# Patient Record
Sex: Male | Born: 1990 | Race: White | Hispanic: No | Marital: Single | Smoking: Current every day smoker
Health system: Southern US, Community
[De-identification: ages and names within clinical notes are randomized; demographics above are authoritative.]

---

## 2016-08-13 ENCOUNTER — Inpatient Hospital Stay (HOSPITAL_COMMUNITY)
Admission: EM | Admit: 2016-08-13 | Discharge: 2016-08-17 | DRG: 581 | Disposition: A | Discharge status: Home / Self Care | Payer: No Typology Code available for payment source | Attending: Internal Medicine | Admitting: Internal Medicine

## 2016-08-13 ENCOUNTER — Encounter (HOSPITAL_COMMUNITY): Payer: Self-pay

## 2016-08-13 DIAGNOSIS — F17210 Nicotine dependence, cigarettes, uncomplicated: Secondary | ICD-10-CM | POA: Diagnosis present

## 2016-08-13 DIAGNOSIS — B182 Chronic viral hepatitis C: Secondary | ICD-10-CM | POA: Diagnosis present

## 2016-08-13 DIAGNOSIS — L0291 Cutaneous abscess, unspecified: Secondary | ICD-10-CM

## 2016-08-13 DIAGNOSIS — B9561 Methicillin susceptible Staphylococcus aureus infection as the cause of diseases classified elsewhere: Secondary | ICD-10-CM | POA: Diagnosis present

## 2016-08-13 DIAGNOSIS — L02413 Cutaneous abscess of right upper limb: Secondary | ICD-10-CM | POA: Diagnosis present

## 2016-08-13 DIAGNOSIS — L03113 Cellulitis of right upper limb: Secondary | ICD-10-CM | POA: Diagnosis not present

## 2016-08-13 DIAGNOSIS — L0390 Cellulitis, unspecified: Secondary | ICD-10-CM | POA: Diagnosis present

## 2016-08-13 LAB — CBC WITH DIFFERENTIAL/PLATELET
BASOS ABS: 0 10*3/uL (ref 0.0–0.1)
Basophils Relative: 0 %
Eosinophils Absolute: 0.1 10*3/uL (ref 0.0–0.7)
Eosinophils Relative: 1 %
HEMATOCRIT: 41.1 % (ref 39.0–52.0)
Hemoglobin: 14.6 g/dL (ref 13.0–17.0)
LYMPHS PCT: 25 %
Lymphs Abs: 2.9 10*3/uL (ref 0.7–4.0)
MCH: 31.3 pg (ref 26.0–34.0)
MCHC: 35.5 g/dL (ref 30.0–36.0)
MCV: 88 fL (ref 78.0–100.0)
Monocytes Absolute: 1 10*3/uL (ref 0.1–1.0)
Monocytes Relative: 8 %
NEUTROS ABS: 7.7 10*3/uL (ref 1.7–7.7)
NEUTROS PCT: 66 %
Platelets: 188 10*3/uL (ref 150–400)
RBC: 4.67 MIL/uL (ref 4.22–5.81)
RDW: 12.4 % (ref 11.5–15.5)
WBC: 11.6 10*3/uL — AB (ref 4.0–10.5)

## 2016-08-13 MED ORDER — MORPHINE SULFATE (PF) 4 MG/ML IV SOLN
4.0000 mg | Freq: Once | INTRAVENOUS | Status: AC
  Administered 2016-08-13: 4 mg via INTRAVENOUS
  Filled 2016-08-13: qty 1

## 2016-08-13 MED ORDER — VANCOMYCIN HCL IN DEXTROSE 1-5 GM/200ML-% IV SOLN
1000.0000 mg | Freq: Once | INTRAVENOUS | Status: AC
  Administered 2016-08-13: 1000 mg via INTRAVENOUS
  Filled 2016-08-13: qty 200

## 2016-08-13 NOTE — ED Triage Notes (Signed)
Pt here for arm swelling of right arm, reports family hx of blood clots, denies trauma or iv drug use. Pulses present. Arm is swollen and warm to touch.

## 2016-08-13 NOTE — ED Provider Notes (Signed)
[facility]-EMERGENCY DEPT Provider Note   CSN: [id number] Arrival date & time: 08/13/16  0865   By signing my name below, I, Clovis Pu, attest that this documentation has been prepared under the direction and in the presence of Dione Booze, MD  Electronically Signed: Clovis Pu, ED Scribe. 08/13/16. 11:17 PM.   History   Chief Complaint Chief Complaint  Patient presents with  . Arm Swelling   The history is provided by the patient. No language interpreter was used.   HPI Comments:  Clarence Garrett is a 26 y.o. male, with a family hx of blood clots, who presents to the Emergency Department complaining of acute onset, worsening right forearm swelling x 3 days. Pt also reports associated "8/10" throbbing pain, skin redness, skin warmth and numbness. His pain is worse upon palpation. He has taken percocet with minimal relief. Pt denies fevers, chills, chest pain. No other associated symptoms noted. No PCP in [place], [place].  History reviewed. No pertinent past medical history.  There are no active problems to display for this patient.   History reviewed. No pertinent surgical history.     Home Medications    Prior to Admission medications   Not on File    Family History History reviewed. No pertinent family history.  Social History Social History  Substance Use Topics  . Smoking status: Not on file  . Smokeless tobacco: Not on file  . Alcohol use Not on file     Allergies   Patient has no known allergies.   Review of Systems Review of Systems  Constitutional: Negative for chills and fever.  Cardiovascular: Negative for chest pain.  Musculoskeletal: Positive for joint swelling and myalgias.  Skin: Positive for color change.  Neurological: Positive for numbness.  All other systems reviewed and are negative.  Physical Exam Updated Vital Signs BP 126/74   Pulse 79   Temp 98.6 F (37 C)   Resp 22   SpO2 95%   Physical Exam  Constitutional: He is oriented to  person, place, and time. He appears well-developed and well-nourished.  HENT:  Head: Normocephalic and atraumatic.  Eyes: EOM are normal. Pupils are equal, round, and reactive to light.  Neck: Normal range of motion. Neck supple. No JVD present.  Cardiovascular: Normal rate, regular rhythm and normal heart sounds.   No murmur heard. Pulmonary/Chest: Effort normal and breath sounds normal. He has no wheezes. He has no rales. He exhibits no tenderness.  Abdominal: Soft. Bowel sounds are normal. He exhibits no distension and no mass. There is no tenderness.  Musculoskeletal: Normal range of motion. He exhibits edema.  Swelling of the right forearm with circumferential  3 cm greater than left forearm. Induration in the right antecubital area. No fluctuance. Distal neurovascular intact  Lymphadenopathy:    He has no cervical adenopathy.  Neurological: He is alert and oriented to person, place, and time. No cranial nerve deficit. He exhibits normal muscle tone. Coordination normal.  Skin: Skin is warm and dry. No rash noted.  Psychiatric: He has a normal mood and affect. His behavior is normal. Judgment and thought content normal.  Nursing note and vitals reviewed.    ED Treatments / Results  DIAGNOSTIC STUDIES:  Oxygen Saturation is 95% on RA, normal by my interpretation.    COORDINATION OF CARE:  11:14 PM Discussed treatment plan with pt at bedside and pt agreed to plan.  Labs (all labs ordered are listed, but only abnormal results are displayed) Labs Reviewed  CBC WITH  DIFFERENTIAL/PLATELET - Abnormal; Notable for the following:       Result Value   WBC 11.6 (*)    All other components within normal limits  BASIC METABOLIC PANEL - Abnormal; Notable for the following:    Chloride 98 (*)    All other components within normal limits  SEDIMENTATION RATE - Abnormal; Notable for the following:    Sed Rate 30 (*)    All other components within normal limits    Procedures Procedures  (including critical care time)  Medications Ordered in ED Medications  vancomycin (VANCOCIN) IVPB 1000 mg/200 mL premix (not administered)  morphine 4 MG/ML injection 4 mg (not administered)     Initial Impression / Assessment and Plan / ED Course  I have reviewed the triage vital signs and the nursing notes.  Pertinent lab results that were available during my care of the patient were reviewed by me and considered in my medical decision making (see chart for details).  Patient with rather rapidly progressing cellulitis of the right forearm. There is some induration present and were evaluated with ultrasound with no obvious fluid collection that was drainable. Unfortunately, images were not able to be archived. He is started on IV antibiotics for cellulitis. He started on vancomycin. Screening labs are obtained and are unremarkable. Sedimentation rate is only mildly elevated. WBC is very mildly elevated. Case is discussed with Dr. Julian ReilGardner of [facility] who agrees to admit the patient.  Final Clinical Impressions(s) / ED Diagnoses   Final diagnoses:  Cellulitis of right forearm    New Prescriptions New Prescriptions   No medications on file   I personally performed the services described in this documentation, which was scribed in my presence. The recorded information has been reviewed and is accurate.       Dione Boozeavid Derriana Oser, MD 08/14/16 [phone]

## 2016-08-14 ENCOUNTER — Encounter (HOSPITAL_COMMUNITY)
Admission: EM | Disposition: A | Discharge status: Home / Self Care | Payer: Self-pay | Source: Home / Self Care | Attending: Internal Medicine

## 2016-08-14 ENCOUNTER — Inpatient Hospital Stay (HOSPITAL_COMMUNITY): Payer: No Typology Code available for payment source | Admitting: Certified Registered Nurse Anesthetist

## 2016-08-14 ENCOUNTER — Inpatient Hospital Stay (HOSPITAL_COMMUNITY): Payer: No Typology Code available for payment source

## 2016-08-14 ENCOUNTER — Encounter (HOSPITAL_COMMUNITY): Payer: Self-pay | Admitting: Radiology

## 2016-08-14 DIAGNOSIS — B9561 Methicillin susceptible Staphylococcus aureus infection as the cause of diseases classified elsewhere: Secondary | ICD-10-CM | POA: Diagnosis present

## 2016-08-14 DIAGNOSIS — L02413 Cutaneous abscess of right upper limb: Secondary | ICD-10-CM | POA: Diagnosis present

## 2016-08-14 DIAGNOSIS — L0390 Cellulitis, unspecified: Secondary | ICD-10-CM | POA: Diagnosis present

## 2016-08-14 DIAGNOSIS — L03113 Cellulitis of right upper limb: Secondary | ICD-10-CM | POA: Diagnosis present

## 2016-08-14 DIAGNOSIS — B182 Chronic viral hepatitis C: Secondary | ICD-10-CM | POA: Diagnosis present

## 2016-08-14 DIAGNOSIS — F17210 Nicotine dependence, cigarettes, uncomplicated: Secondary | ICD-10-CM | POA: Diagnosis present

## 2016-08-14 DIAGNOSIS — L0291 Cutaneous abscess, unspecified: Secondary | ICD-10-CM | POA: Insufficient documentation

## 2016-08-14 HISTORY — PX: I & D EXTREMITY: SHX5045

## 2016-08-14 LAB — C-REACTIVE PROTEIN: CRP: 7.8 mg/dL — AB (ref <1.0)

## 2016-08-14 LAB — BASIC METABOLIC PANEL
Anion gap: 10 (ref 5–15)
BUN: 9 mg/dL (ref 6–20)
CHLORIDE: 98 mmol/L — AB (ref 101–111)
CO2: 27 mmol/L (ref 22–32)
CREATININE: 1 mg/dL (ref 0.61–1.24)
Calcium: 8.9 mg/dL (ref 8.9–10.3)
GFR calc non Af Amer: >60 mL/min (ref >60)
GLUCOSE: 91 mg/dL (ref 65–99)
Potassium: 4.4 mmol/L (ref 3.5–5.1)
Sodium: 135 mmol/L (ref 135–145)

## 2016-08-14 LAB — PROTIME-INR
INR: 1.09
PROTHROMBIN TIME: 14.2 s (ref 11.4–15.2)

## 2016-08-14 LAB — SEDIMENTATION RATE: Sed Rate: 30 mm/hr — ABNORMAL HIGH (ref 0–16)

## 2016-08-14 LAB — APTT: aPTT: 34 seconds (ref 24–36)

## 2016-08-14 SURGERY — IRRIGATION AND DEBRIDEMENT EXTREMITY
Anesthesia: General | Site: Arm Lower | Laterality: Right

## 2016-08-14 MED ORDER — SUCCINYLCHOLINE CHLORIDE 20 MG/ML IJ SOLN
INTRAMUSCULAR | Status: DC | PRN
  Administered 2016-08-14: 140 mg via INTRAVENOUS

## 2016-08-14 MED ORDER — HEPARIN SODIUM (PORCINE) 5000 UNIT/ML IJ SOLN: 5000.0000 [IU] | Freq: Three times a day (TID) | INTRAMUSCULAR | Status: DC

## 2016-08-14 MED ORDER — ENOXAPARIN SODIUM 40 MG/0.4ML ~~LOC~~ SOLN
40.0000 mg | Freq: Every day | SUBCUTANEOUS | Status: DC
  Filled 2016-08-14: qty 0.4

## 2016-08-14 MED ORDER — OXYCODONE-ACETAMINOPHEN 5-325 MG PO TABS
1.0000 | ORAL_TABLET | Freq: Four times a day (QID) | ORAL | Status: DC | PRN
  Administered 2016-08-14 – 2016-08-15 (×4): 2 via ORAL
  Filled 2016-08-14 (×4): qty 2

## 2016-08-14 MED ORDER — 0.9 % SODIUM CHLORIDE (POUR BTL) OPTIME
TOPICAL | Status: DC | PRN
  Administered 2016-08-14: 1000 mL

## 2016-08-14 MED ORDER — LACTATED RINGERS IV SOLN
INTRAVENOUS | Status: DC | PRN
  Administered 2016-08-14 (×2): via INTRAVENOUS

## 2016-08-14 MED ORDER — ONDANSETRON HCL 4 MG/2ML IJ SOLN: 4.0000 mg | Freq: Once | INTRAMUSCULAR | Status: DC | PRN

## 2016-08-14 MED ORDER — HYDROMORPHONE HCL 1 MG/ML IJ SOLN
0.2500 mg | INTRAMUSCULAR | Status: DC | PRN
  Administered 2016-08-14: 0.5 mg via INTRAVENOUS

## 2016-08-14 MED ORDER — PROPOFOL 10 MG/ML IV BOLUS
INTRAVENOUS | Status: DC | PRN
  Administered 2016-08-14: 50 mg via INTRAVENOUS
  Administered 2016-08-14: 150 mg via INTRAVENOUS

## 2016-08-14 MED ORDER — PIPERACILLIN-TAZOBACTAM 3.375 G IVPB
3.3750 g | Freq: Three times a day (TID) | INTRAVENOUS | Status: DC
  Administered 2016-08-14 – 2016-08-15 (×4): 3.375 g via INTRAVENOUS
  Filled 2016-08-14 (×6): qty 50

## 2016-08-14 MED ORDER — IOPAMIDOL (ISOVUE-300) INJECTION 61%
INTRAVENOUS | Status: AC
  Administered 2016-08-14: 100 mL
  Filled 2016-08-14: qty 100

## 2016-08-14 MED ORDER — PHENYLEPHRINE HCL 10 MG/ML IJ SOLN
INTRAMUSCULAR | Status: DC | PRN
  Administered 2016-08-14 (×3): 80 ug via INTRAVENOUS

## 2016-08-14 MED ORDER — HYDROMORPHONE HCL 1 MG/ML IJ SOLN
INTRAMUSCULAR | Status: AC
  Filled 2016-08-14: qty 0.5

## 2016-08-14 MED ORDER — NICOTINE 21 MG/24HR TD PT24
21.0000 mg | MEDICATED_PATCH | Freq: Every day | TRANSDERMAL | Status: DC
  Administered 2016-08-14 – 2016-08-17 (×4): 21 mg via TRANSDERMAL
  Filled 2016-08-14 (×4): qty 1

## 2016-08-14 MED ORDER — SODIUM CHLORIDE 0.9 % IR SOLN
Status: DC | PRN
  Administered 2016-08-14 (×2): 3000 mL

## 2016-08-14 MED ORDER — VANCOMYCIN HCL IN DEXTROSE 1-5 GM/200ML-% IV SOLN: 1000.0000 mg | Freq: Once | INTRAVENOUS | Status: DC

## 2016-08-14 MED ORDER — MIDAZOLAM HCL 5 MG/5ML IJ SOLN
INTRAMUSCULAR | Status: DC | PRN
  Administered 2016-08-14: 2 mg via INTRAVENOUS

## 2016-08-14 MED ORDER — FENTANYL CITRATE (PF) 100 MCG/2ML IJ SOLN
INTRAMUSCULAR | Status: DC | PRN
  Administered 2016-08-14 (×2): 50 ug via INTRAVENOUS
  Administered 2016-08-14: 100 ug via INTRAVENOUS

## 2016-08-14 MED ORDER — CEFAZOLIN IN D5W 1 GM/50ML IV SOLN
1.0000 g | Freq: Three times a day (TID) | INTRAVENOUS | Status: DC
  Administered 2016-08-14: 1 g via INTRAVENOUS
  Filled 2016-08-14: qty 50

## 2016-08-14 MED ORDER — VANCOMYCIN HCL IN DEXTROSE 1-5 GM/200ML-% IV SOLN
1000.0000 mg | Freq: Three times a day (TID) | INTRAVENOUS | Status: DC
  Administered 2016-08-14 – 2016-08-16 (×6): 1000 mg via INTRAVENOUS
  Filled 2016-08-14 (×9): qty 200

## 2016-08-14 MED ORDER — PIPERACILLIN-TAZOBACTAM 3.375 G IVPB 30 MIN: 3.3750 g | Freq: Once | INTRAVENOUS | Status: DC

## 2016-08-14 MED ORDER — SODIUM CHLORIDE 0.9 % IV SOLN
INTRAVENOUS | Status: DC
  Administered 2016-08-14: 18:00:00 via INTRAVENOUS
  Administered 2016-08-14: 1000 mL via INTRAVENOUS

## 2016-08-14 MED ORDER — HYDROMORPHONE HCL 2 MG/ML IJ SOLN
1.0000 mg | INTRAMUSCULAR | Status: DC | PRN
  Administered 2016-08-14 – 2016-08-17 (×11): 1 mg via INTRAVENOUS
  Filled 2016-08-14 (×13): qty 1

## 2016-08-14 MED ORDER — LIDOCAINE HCL (CARDIAC) 20 MG/ML IV SOLN
INTRAVENOUS | Status: DC | PRN
  Administered 2016-08-14: 100 mg via INTRAVENOUS

## 2016-08-14 MED ORDER — MEPERIDINE HCL 25 MG/ML IJ SOLN: 6.2500 mg | INTRAMUSCULAR | Status: DC | PRN

## 2016-08-14 MED ORDER — ONDANSETRON HCL 4 MG/2ML IJ SOLN
INTRAMUSCULAR | Status: DC | PRN
  Administered 2016-08-14: 4 mg via INTRAVENOUS

## 2016-08-14 SURGICAL SUPPLY — 51 items
BANDAGE ELASTIC 3 VELCRO ST LF (GAUZE/BANDAGES/DRESSINGS) ×3 IMPLANT
BANDAGE ELASTIC 4 VELCRO ST LF (GAUZE/BANDAGES/DRESSINGS) ×3 IMPLANT
BNDG COHESIVE 4X5 TAN STRL (GAUZE/BANDAGES/DRESSINGS) IMPLANT
BNDG GAUZE ELAST 4 BULKY (GAUZE/BANDAGES/DRESSINGS) ×6 IMPLANT
BNDG GAUZE STRTCH 6 (GAUZE/BANDAGES/DRESSINGS) IMPLANT
BRUSH SCRUB DISP (MISCELLANEOUS) ×3 IMPLANT
COVER SURGICAL LIGHT HANDLE (MISCELLANEOUS) ×3 IMPLANT
CUFF TOURNIQUET SINGLE 18IN (TOURNIQUET CUFF) ×3 IMPLANT
DRAIN PENROSE 1/4X12 LTX STRL (WOUND CARE) ×3 IMPLANT
DRAPE U-SHAPE 47X51 STRL (DRAPES) ×3 IMPLANT
DRSG ADAPTIC 3X8 NADH LF (GAUZE/BANDAGES/DRESSINGS) ×3 IMPLANT
DRSG PAD ABDOMINAL 8X10 ST (GAUZE/BANDAGES/DRESSINGS) ×6 IMPLANT
ELECT REM PT RETURN 9FT ADLT (ELECTROSURGICAL) ×3
ELECTRODE REM PT RTRN 9FT ADLT (ELECTROSURGICAL) ×1 IMPLANT
GAUZE SPONGE 4X4 12PLY STRL (GAUZE/BANDAGES/DRESSINGS) ×3 IMPLANT
GLOVE BIO SURGEON STRL SZ7.5 (GLOVE) ×3 IMPLANT
GLOVE BIO SURGEON STRL SZ8 (GLOVE) ×3 IMPLANT
GLOVE BIOGEL PI IND STRL 7.5 (GLOVE) ×1 IMPLANT
GLOVE BIOGEL PI IND STRL 8 (GLOVE) ×1 IMPLANT
GLOVE BIOGEL PI INDICATOR 7.5 (GLOVE) ×2
GLOVE BIOGEL PI INDICATOR 8 (GLOVE) ×2
GOWN STRL REUS W/ TWL LRG LVL3 (GOWN DISPOSABLE) ×2 IMPLANT
GOWN STRL REUS W/ TWL XL LVL3 (GOWN DISPOSABLE) ×1 IMPLANT
GOWN STRL REUS W/TWL LRG LVL3 (GOWN DISPOSABLE) ×4
GOWN STRL REUS W/TWL XL LVL3 (GOWN DISPOSABLE) ×2
HANDPIECE INTERPULSE COAX TIP (DISPOSABLE)
KIT BASIN OR (CUSTOM PROCEDURE TRAY) ×3 IMPLANT
KIT ROOM TURNOVER OR (KITS) ×3 IMPLANT
MANIFOLD NEPTUNE II (INSTRUMENTS) ×3 IMPLANT
NS IRRIG 1000ML POUR BTL (IV SOLUTION) ×3 IMPLANT
PACK ORTHO EXTREMITY (CUSTOM PROCEDURE TRAY) ×3 IMPLANT
PAD ARMBOARD 7.5X6 YLW CONV (MISCELLANEOUS) ×6 IMPLANT
PAD CAST 3X4 CTTN HI CHSV (CAST SUPPLIES) ×1 IMPLANT
PAD CAST 4YDX4 CTTN HI CHSV (CAST SUPPLIES) ×1 IMPLANT
PADDING CAST COTTON 3X4 STRL (CAST SUPPLIES) ×2
PADDING CAST COTTON 4X4 STRL (CAST SUPPLIES) ×2
PADDING CAST COTTON 6X4 STRL (CAST SUPPLIES) IMPLANT
SET CYSTO W/LG BORE CLAMP LF (SET/KITS/TRAYS/PACK) ×3 IMPLANT
SET HNDPC FAN SPRY TIP SCT (DISPOSABLE) IMPLANT
SPONGE LAP 18X18 X RAY DECT (DISPOSABLE) ×3 IMPLANT
STOCKINETTE IMPERVIOUS 9X36 MD (GAUZE/BANDAGES/DRESSINGS) IMPLANT
SUT ETHILON 2 0 FS 18 (SUTURE) ×3 IMPLANT
SUT PDS AB 2-0 CT1 27 (SUTURE) IMPLANT
TOWEL OR 17X24 6PK STRL BLUE (TOWEL DISPOSABLE) ×3 IMPLANT
TOWEL OR 17X26 10 PK STRL BLUE (TOWEL DISPOSABLE) ×3 IMPLANT
TUBE ANAEROBIC SPECIMEN COL (MISCELLANEOUS) IMPLANT
TUBE CONNECTING 12'X1/4 (SUCTIONS) ×1
TUBE CONNECTING 12X1/4 (SUCTIONS) ×2 IMPLANT
UNDERPAD 30X30 (UNDERPADS AND DIAPERS) ×3 IMPLANT
WATER STERILE IRR 1000ML POUR (IV SOLUTION) IMPLANT
YANKAUER SUCT BULB TIP NO VENT (SUCTIONS) ×3 IMPLANT

## 2016-08-14 NOTE — Progress Notes (Signed)
Patient admitted from ED around 1400H and went to the OR for surgery. Patient is AAO x4, OOB independently.

## 2016-08-14 NOTE — Anesthesia Procedure Notes (Signed)
Procedure Name: Intubation Date/Time: 08/14/2016 3:45 PM Performed by: Reine JustFLOWERS, Owenn Rothermel T Pre-anesthesia Checklist: Patient identified, Emergency Drugs available, Suction available, Patient being monitored and Timeout performed Patient Re-evaluated:Patient Re-evaluated prior to inductionOxygen Delivery Method: Circle system utilized and Simple face mask Preoxygenation: Pre-oxygenation with 100% oxygen Intubation Type: IV induction Ventilation: Mask ventilation without difficulty Laryngoscope Size: Miller and 3 Grade View: Grade I Tube type: Oral Tube size: 7.5 mm Number of attempts: 1 Airway Equipment and Method: Patient positioned with wedge pillow and Stylet Placement Confirmation: ETT inserted through vocal cords under direct vision,  positive ETCO2 and breath sounds checked- equal and bilateral Secured at: 22 cm Tube secured with: Tape Dental Injury: Teeth and Oropharynx as per pre-operative assessment

## 2016-08-14 NOTE — Progress Notes (Signed)
Admission nurse notified of new patient.  

## 2016-08-14 NOTE — Progress Notes (Signed)
Patient arrived from PACU to 6n19, new right arm ace wrap post I&D. Patient in moderate pain just received pain medicine at 1712. Iv intact, on 2L o2,VSS, alert and oriented, will continue to monitor.

## 2016-08-14 NOTE — Anesthesia Postprocedure Evaluation (Signed)
Anesthesia Post Note  Patient: Clarence Garrett  Procedure(s) Performed: Procedure(s) (LRB): IRRIGATION AND DEBRIDEMENT EXTREMITY FOREARM (Right)  Patient location during evaluation: PACU Anesthesia Type: General Level of consciousness: awake and alert Pain management: pain level controlled Vital Signs Assessment: post-procedure vital signs reviewed and stable Respiratory status: spontaneous breathing, nonlabored ventilation, respiratory function stable and patient connected to nasal cannula oxygen Cardiovascular status: blood pressure returned to baseline and stable Postop Assessment: no signs of nausea or vomiting Anesthetic complications: no       Last Vitals:  Vitals:   08/14/16 1715 08/14/16 1724  BP: (!) 147/91 (!) 146/81  Pulse:  76  Resp:  18  Temp:      Last Pain:  Vitals:   08/14/16 1715  TempSrc:   PainSc: Asleep                 [name]

## 2016-08-14 NOTE — Progress Notes (Signed)
Pharmacy Antibiotic Note  Clarence HartshornDavid Garrett is a 26 y.o. male admitted on 08/13/2016 with worsening cellulitis after suffering a scrape on his arm, from scraping his arm against metal while working. WBC 11.6, SCr wnl, afebrile, BP wnl.   Plan: -Vancomycin 1g/8h -Zosyn 3.375 g IV q12h -Monitor renal fx, cultures, VT as indicated -Deescalate as appropriate   Recent Labs Lab 08/13/16 2327  WBC 11.6*  CREATININE 1.00    CrCl cannot be calculated (Unknown ideal weight.).    No Known Allergies   Antimicrobials this admission: 1/24 Ancef x1 1/24 vancomycin > 1/25 zosyn >  Dose adjustments this admission: N/A  Microbiology results: N/A  Thank you for allowing pharmacy to be a part of this patient's care.  Clarence FridayMasters, Clarence Garrett M 08/14/2016 8:45 AM

## 2016-08-14 NOTE — Consult Note (Signed)
[facility] H&P/Consult     Chief Complaint:  R arm abscess Requesting: Dr. Tana Coast HPI:   Clarence Garrett is an 26 y.o. RHD white male presented to the emergency department yesterday evening around 2000. He presented with increasing right forearm pain, swelling and redness since Sunday. Patient does not recall any specific event other than hitting his right arm on a solar panel last week with small superficial laceration. Thinks it may have been a spider bite. His pain has been worsened over the last several days necessitating evaluation in the emergency department. He is also noted expanding erythema to the ventral aspect of his right forearm proximally. Patient denies any fevers or chills. No recent illnesses no body aches. No headaches, no neck pain. No abdominal pain. No shortness of breath. No palpitations.   Initially patient was started on Ancef and given 1 dose of vancomycin in the ED. His pain was worse this morning. CT was obtained which noted abscess in the superficial tissues over the antecubital fossa. Antibiotics were switched to Zosyn and vancomycin. Sedimentation rate and CRP are elevated   Patient denies use of IV drugs or other drugs. Denies alcohol use. States that he does smoke 1 pack a day   Patient works for a Education [employer], environmental. 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 He's been here about 2 weeks.       Pt last ate 0830-0900  History reviewed. No pertinent past medical history.  History reviewed. No pertinent surgical history.  Family History  Problem Relation Age of Onset  . Deep vein thrombosis Father     spontaneous   . Deep vein thrombosis Paternal Grandmother     spontaneous    Social History:  reports that he has been smoking Cigarettes.  He has been smoking about 1.00 pack per day. He does not have any smokeless tobacco history on file. He reports that he does not drink alcohol or use  drugs.  Allergies: No Known Allergies  No current facility-administered medications on file prior to encounter.    No current outpatient prescriptions on file prior to encounter.    Results for orders placed or performed during the hospital encounter of 08/13/16 (from the past 48 hour(s))  CBC with Differential     Status: Abnormal   Collection Time: 08/13/16 11:27 PM  Result Value Ref Range   WBC 11.6 (H) 4.0 - 10.5 K/uL   RBC 4.67 4.22 - 5.81 MIL/uL   Hemoglobin 14.6 13.0 - 17.0 g/dL   HCT 41.1 39.0 - 52.0 %   MCV 88.0 78.0 - 100.0 fL   MCH 31.3 26.0 - 34.0 pg   MCHC 35.5 30.0 - 36.0 g/dL   RDW 12.4 11.5 - 15.5 %   Platelets 188 150 - 400 K/uL   Neutrophils Relative % 66 %   Neutro Abs 7.7 1.7 - 7.7 K/uL   Lymphocytes Relative 25 %   Lymphs Abs 2.9 0.7 - 4.0 K/uL   Monocytes Relative 8 %   Monocytes Absolute 1.0 0.1 - 1.0 K/uL   Eosinophils Relative 1 %   Eosinophils Absolute 0.1 0.0 - 0.7 K/uL   Basophils Relative 0 %   Basophils Absolute 0.0 0.0 - 0.1 K/uL  Basic metabolic panel     Status: Abnormal   Collection Time: 08/13/16 11:27 PM  Result Value Ref Range   Sodium 135 135 - 145 mmol/L   Potassium 4.4 3.5 - 5.1 mmol/L  Chloride 98 (L) 101 - 111 mmol/L   CO2 27 22 - 32 mmol/L   Glucose, Bld 91 65 - 99 mg/dL   BUN 9 6 - 20 mg/dL   Creatinine, Ser 1.00 0.61 - 1.24 mg/dL   Calcium 8.9 8.9 - 10.3 mg/dL   GFR calc non Af Amer >60 >60 mL/min   GFR calc Af Amer >60 >60 mL/min    Comment: (NOTE) The eGFR has been calculated using the CKD EPI equation. This calculation has not been validated in all clinical situations. eGFR's persistently <60 mL/min signify possible Chronic Kidney Disease.    Anion gap 10 5 - 15  Sedimentation rate     Status: Abnormal   Collection Time: 08/13/16 11:27 PM  Result Value Ref Range   Sed Rate 30 (H) 0 - 16 mm/hr  C-reactive protein     Status: Abnormal   Collection Time: 08/14/16  9:08 AM  Result Value Ref Range   CRP 7.8 (H)  <1.0 mg/dL   Ct Forearm Right W Contrast  Result Date: 08/14/2016 CLINICAL DATA:  Erythema of the forearm.  Possible abscess. EXAM: CT OF THE UPPER RIGHT EXTREMITY WITH CONTRAST TECHNIQUE: Multidetector CT imaging of the upper right extremity was performed according to the standard protocol following intravenous contrast administration. COMPARISON:  None. CONTRAST:  14m ISOVUE-300 IOPAMIDOL (ISOVUE-300) INJECTION 61%<Contrast>1012mISOVUE-300 IOPAMIDOL (ISOVUE-300) INJECTION 61% FINDINGS: Bones/Joint/Cartilage No bony destructive findings characteristic of osteomyelitis. No elbow joint effusion. Ligaments Suboptimally assessed by CT. Muscles and Tendons Edema within along the margins of the distal biceps muscle due to the abscess. There is also fascia all edema tracking adjacent to the pronator teres and brachialis muscles in the vicinity abscess. Soft tissues 4.3 by 2.3 by 2.2 cm (volume = 11 cm^3) rim enhancing abscess along the distal medial margin of the biceps and interposed between the biceps, pronator teres, and brachialis muscles. This abscess is directly in the vicinity of the median cubital vein which is presumably thrombosed. There is some stranding in the vicinity of the adjacent brachial artery and median nerve, as well as inflammatory stranding along the distal upper arm. Circumferential subcutaneous edema is present in the forearm and tracks all the way down to the level of the radiocarpal joint. Aside from the deeper fasciitis in the vicinity of the abscess itself, the edema appears to be predominantly subcutaneous and no other abscesses are identified. IMPRESSION: 1. Abscess in the vicinity of the median cubital vein just proximal to the elbow, which is thrombosed or occluded. The 11 cc abscess tracks along the distal medial margin of the biceps muscle and interposed between the biceps, pronator teres, and brachialis muscles. There is local fascia all edema around the abscess along with  subcutaneous edema favoring cellulitis tracks in length distally in the forearm to the level of the wrist. No elbow joint effusion or CT findings of osteomyelitis. Electronically Signed   By: WaVan Clines.D.   On: 08/14/2016 10:39    Review of Systems  Constitutional: Negative for chills, fever and malaise/fatigue.  HENT: Negative for ear pain and sinus pain.   Respiratory: Negative for shortness of breath.   Cardiovascular: Negative for chest pain and palpitations.  Gastrointestinal: Negative for abdominal pain, nausea and vomiting.  Musculoskeletal: Negative for neck pain.  Neurological: Negative for tingling and sensory change.    Blood pressure 109/66, pulse 80, temperature 98.6 F (37 C), resp. rate 20, height 6' 1"  (1.854 m), weight 98 kg (216 lb), SpO2 99 %.  Physical Exam  Constitutional: He is oriented to person, place, and time. He is cooperative.  Non-toxic appearance. No distress.  HENT:  Head: Normocephalic.  Mouth/Throat: Normal dentition. No dental caries.  Denies any dental pain   Neck: Normal range of motion and full passive range of motion without pain. No spinous process tenderness and no muscular tenderness present. No neck rigidity. Normal range of motion present.  Cardiovascular: Regular rhythm.   Pulmonary/Chest: No respiratory distress.  Musculoskeletal:  Right upper Extremity  Inspection:   Extensive tattoos to all extremities and trunk     Erythema noted to medial elbow with some expansion proximally to distal upper arm and distally to proximal ventral forearm    No actively draining wounds     Very small healing laceration to ventral aspect of lateral proximal foreram. There is no erythema around this healing lac     No acute findings noted to hand, wrist, shoulder     Bony eval:    Shoulder, elbow, forearm, wrist and hand are nontender   Soft tissue:    Significant swelling to proximal ventral forearm and distal upper arm     Soft tissue is  indurated and tender to the antecubital space     Erythema medial aspect R elbow, extending over to forearm ventrally and medial distal aspect of anterior upper arm     No streaking erythema noted     Swelling controlled distally     Posterior elbow is w/o swelling     No erythema over olecranon   ROM:    No pain with passive ROM of elbow    ROM of elbow limited by swelling    Pt unable to actively flex elbow     Pain with forearm motion but not too severe    Wrist ROM w/o pain   Sensation:   Radial, ulnar, medial nv sensation intact    Axillary nerve sensation intact   Motor:   Radial, ulnar, median, AIN, PIN motor intact    Difficulty with elbow flexion  Vascular:    + radial pulse    Extremity warm     Compartments are soft, no pain with passive stretching    Left upper extremity and B LEx    No additional wounds appreciated   Small abrasion to anterior R lower leg but this is healing well    No additional acute findings noted   Neurological: He is alert and oriented to person, place, and time.  Psychiatric: He has a normal mood and affect. Cognition and memory are normal.      Assessment/Plan  26 y/o white male w/o PMHx with acute abscess R antecubital soft tissue   -abscess R antecubital space   OR for I&D this afternoon (last ate around 0830-0900)  Will likely leave wound open and put vac in place with return to OR in 2-3 days or may close loosely with penrose drain in place  May place in splint as well to limit motion at surgical site vs bulky soft dressing    Pt has been on abx since admission but will send intra-op cultures   Atypical to have a "normal" host develop Abscess even in setting of soft tissue laceration. Laceration not really in the same location as pts erythema or abscess, laceration does not have any surrounding erythema as well.    Additional preop labs have been ordered   tox screens ordered. Pt did receive morphine yesterday at 2300    Have  also ordered hepatitis panel and HIV screen   - ID:   On vanc and zosyn    - FEN/GI prophylaxis/Foley/Lines:  NPO    - Dispo:  OR this afternoon for I&D R arm    Jari Pigg, PA-C [facility] [phone] (P) 08/14/2016, 1:19 PM

## 2016-08-14 NOTE — H&P (Addendum)
History and Physical    Clarence Garrett ZOX:[id number] DOB: 04/20/1991 DOA: 08/13/2016   PCP: No primary care provider on file. Chief Complaint:  Chief Complaint  Patient presents with  . Arm Swelling    HPI: Clarence Garrett is a 26 y.o. male with medical history significant of previously healthy.  Patient presents to the ED with c/o R forearm swelling for the past 3 days.  Symptoms are worsening since onset.  8/10 throbbing pain.  Erythema.  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  He states his tetanus is up to date.  Tattoos on arms are not recent or new he states.  ED Course: Patient given 1 dose of vanc.  WBC 11k.  No other SIRS.  Review of Systems: As per HPI otherwise 10 point review of systems negative.    History reviewed. No pertinent past medical history.  History reviewed. No pertinent surgical history.   has no tobacco, alcohol, and drug history on file.  No Known Allergies  History reviewed. No pertinent family history. No family h/o MRSA, does have family history of clotting disorder.   Prior to Admission medications   Not on File    Physical Exam: Vitals:   08/13/16 1947 08/13/16 2229 08/13/16 2300 08/13/16 2315  BP: 139/78 115/55 126/74 132/71  Pulse: 80 95 79 88  Resp: 23 22    Temp: 98.6 F (37 C)     SpO2: 100% 95% 95% 97%      Constitutional: NAD, calm, comfortable Eyes: PERRL, lids and conjunctivae normal ENMT: Mucous membranes are moist. Posterior pharynx clear of any exudate or lesions.Normal dentition.  Neck: normal, supple, no masses, no thyromegaly Respiratory: clear to auscultation bilaterally, no wheezing, no crackles. Normal respiratory effort. No accessory muscle use.  Cardiovascular: Regular rate and rhythm, no murmurs / rubs / gallops. No extremity edema. 2+ pedal pulses. No carotid bruits.  Abdomen: no tenderness, no masses palpated.  No hepatosplenomegaly. Bowel sounds positive.  Musculoskeletal: no clubbing / cyanosis. No joint deformity upper and lower extremities. Good ROM, no contractures. Normal muscle tone.  Skin: R arm erythema, edema, tenderness, no obvious fluctuance or drainage.  Small scrape near R elbow. Neurologic: CN 2-12 grossly intact. Sensation intact, DTR normal. Strength 5/5 in all 4.  Psychiatric: Normal judgment and insight. Alert and oriented x 3. Normal mood.    Labs on Admission: I have personally reviewed following labs and imaging studies  CBC:  Recent Labs Lab 08/13/16 2327  WBC 11.6*  NEUTROABS 7.7  HGB 14.6  HCT 41.1  MCV 88.0  PLT 188   Basic Metabolic Panel:  Recent Labs Lab 08/13/16 2327  NA 135  K 4.4  CL 98*  CO2 27  GLUCOSE 91  BUN 9  CREATININE 1.00  CALCIUM 8.9   GFR: CrCl cannot be calculated (Unknown ideal weight.). Liver Function Tests: No results for input(s): AST, ALT, ALKPHOS, BILITOT, PROT, ALBUMIN in the last 168 hours. No results for input(s): LIPASE, AMYLASE in the last 168 hours. No results for input(s): AMMONIA in the last 168 hours. Coagulation Profile: No results for input(s): INR, PROTIME in the last 168 hours. Cardiac Enzymes: No results for input(s): CKTOTAL, CKMB, CKMBINDEX, TROPONINI in the last 168 hours. BNP (last 3 results) No results for input(s): PROBNP in the last 8760 hours. HbA1C: No results for input(s): HGBA1C in the last 72 hours. CBG:  No results for input(s): GLUCAP in the last 168 hours. Lipid Profile: No results for input(s): CHOL, HDL, LDLCALC, TRIG, CHOLHDL, LDLDIRECT in the last 72 hours. Thyroid Function Tests: No results for input(s): TSH, T4TOTAL, FREET4, T3FREE, THYROIDAB in the last 72 hours. Anemia Panel: No results for input(s): VITAMINB12, FOLATE, FERRITIN, TIBC, IRON, RETICCTPCT in the last 72 hours. Urine analysis: No results found for: COLORURINE, APPEARANCEUR, LABSPEC, PHURINE, GLUCOSEU, HGBUR,  BILIRUBINUR, KETONESUR, PROTEINUR, UROBILINOGEN, NITRITE, LEUKOCYTESUR Sepsis Labs: @LABRCNTIP (procalcitonin:4,lacticidven:4) )No results found for this or any previous visit (from the past 240 hour(s)).   Radiological Exams on Admission: No results found.  EKG: Independently reviewed.  Assessment/Plan Principal Problem:   Cellulitis of right forearm    1. Cellulitis of R forearm - 1. Tetanus up to date per patient 2. Got 1 dose of vanc in ED 3. Will put patient on Ancef 1gm Q8H per cellulitis pathway. 4. Patient is not septic, cellulitis is non-purulent at this point, and I dont really have any MRSA high risk factors to justify leaving him on vancomycin for the moment.  If he fails to improve or worsens then plan to resume vanc for MRSA coverage.   DVT prophylaxis: Lovenox Code Status: Full Family Communication: No family in room Consults called: None Admission status: Admit to obs   GARDNER, Heywood IlesJARED M. DO [facility] Pager [phone] from 7PM-7AM  If 7AM-7PM, please contact the day physician for the patient [email or web address] Password [email or web address]  08/14/2016, 1:20 AM

## 2016-08-14 NOTE — Brief Op Note (Signed)
08/13/2016 - 08/14/2016  4:38 PM  PATIENT:  Clarence Garrett  26 y.o. male  PRE-OPERATIVE DIAGNOSIS:  RIGHT ELBOW ABSCESS ANTECUBITAL FOSSA  POST-OPERATIVE DIAGNOSIS:  RIGHT ELBOW ABSCESS ANTECUBITAL FOSSA  PROCEDURE:  Procedure(s): INCISION AND DRAINAGE RIGHT ELBOW ANTECUBITAL ABSCESS (Right)  SURGEON:  Surgeon(s) and Role:    * Myrene GalasMichael Sarika Baldini, MD - Primary  PHYSICIAN ASSISTANT: Montez MoritaKEITH PAUL, PAC  ANESTHESIA:   general  EBL:  Total I/O In: 1000 [I.V.:700; IV Piggyback:300] Out: 0   BLOOD ADMINISTERED:none  DRAINS: Penrose drain in the antecubital space proximally and distally.   LOCAL MEDICATIONS USED:  NONE  SPECIMEN:  Source of Specimen:  abscess  DISPOSITION OF SPECIMEN:  micro  COUNTS:  YES  TOURNIQUET:  Yes  DICTATION: .Other Dictation: Dictation Number [id number]  PLAN OF CARE: Admit to inpatient   PATIENT DISPOSITION:  PACU - hemodynamically stable.   Delay start of Pharmacological VTE agent (>24hrs) due to surgical blood loss or risk of bleeding: yes

## 2016-08-14 NOTE — Anesthesia Preprocedure Evaluation (Signed)
Anesthesia Evaluation  Patient identified by MRN, date of birth, ID band Patient awake    Reviewed: Allergy & Precautions, NPO status , Patient's Chart, lab work & pertinent test results  Airway Mallampati: I  TM Distance: >3 FB Neck ROM: Full    Dental   Pulmonary Current Smoker,    Pulmonary exam normal        Cardiovascular Normal cardiovascular exam     Neuro/Psych    GI/Hepatic   Endo/Other    Renal/GU      Musculoskeletal   Abdominal   Peds  Hematology   Anesthesia Other Findings   Reproductive/Obstetrics                             Anesthesia Physical Anesthesia Plan  ASA: II  Anesthesia Plan: General   Post-op Pain Management:    Induction: Intravenous  Airway Management Planned: LMA  Additional Equipment:   Intra-op Plan:   Post-operative Plan: Extubation in OR  Informed Consent: I have reviewed the patients History and Physical, chart, labs and discussed the procedure including the risks, benefits and alternatives for the proposed anesthesia with the patient or authorized representative who has indicated his/her understanding and acceptance.     Plan Discussed with: CRNA and Surgeon  Anesthesia Plan Comments:         Anesthesia Quick Evaluation  

## 2016-08-14 NOTE — OR Nursing (Signed)
1/4 inch penrose placed in right forearm by surgeon.

## 2016-08-14 NOTE — Progress Notes (Signed)
[facility]                                                                              Patient Demographics  Clarence Garrett, is a 26 y.o. male, DOB - 08-29-1990, ZOX:[id number]  Admit date - 08/13/2016   Admitting Physician No admitting provider for patient encounter.  Outpatient Primary MD for the patient is No primary care provider on file.  Outpatient specialists:   LOS - 0  days    Chief Complaint  Patient presents with  . Arm Swelling       Brief summary  Patient is a 26 year old male with no significant past medical history presented with right forearm swelling for the past 3  days, with 8/10 throbbing pain, erythema. 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 His tetanus shot is up-to-date.    Assessment & Plan    Principal Problem:   Cellulitis And abscess of right forearm - Patient seen and examined, feels worsening since admission. Area examined with erythema and 3 cm tender induration in the vicinity of the elbow joint - Stat CT forearm ordered which showed abscess in the vicinity of the median cubital vein, 4.3 x 2.3 x 2.2 cm  rim enhancing abscess along the distal margin of the biceps and interposed between the biceps, pronator teres and brachialis muscle - Discontinued Ancef, changed antibiotics to IV vancomycin and Zosyn - Discussed with orthopedics, Dr. Carola Frost, recommended NPO, possible OR today - Continue pain control, IV fluids  Code Status:  Full  DVT Prophylaxis:   SCD's Family Communication: Discussed in detail with the patient, all imaging results, lab results explained to the patient   Disposition Plan:   Time Spent in minutes  25  minutes  Procedures:  CT right forearm  Consultants:   Orthopedics, Dr. Carola Frost  Antimicrobials:   IV vancomycin 1/25  IV Zosyn 1/25   Medications  Scheduled Meds: Continuous Infusions: . sodium chloride    . piperacillin-tazobactam (ZOSYN)   IV Stopped (08/14/16 1155)  . vancomycin Stopped (08/14/16 1126)   PRN Meds:.HYDROmorphone (DILAUDID) injection   Antibiotics   Anti-infectives    Start     Dose/Rate Route Frequency Ordered Stop   08/14/16 0930  vancomycin (VANCOCIN) IVPB 1000 mg/200 mL premix     1,000 mg 200 mL/hr over 60 Minutes Intravenous Every 8 hours 08/14/16 0903     08/14/16 0930  piperacillin-tazobactam (ZOSYN) IVPB 3.375 g     3.375 g 12.5 mL/hr over 240 Minutes Intravenous Every 8 hours 08/14/16 0903     08/14/16 0845  piperacillin-tazobactam (ZOSYN) IVPB 3.375 g  Status:  Discontinued     3.375 g 100 mL/hr over 30 Minutes Intravenous  Once 08/14/16 0834 08/14/16 0904   08/14/16 0845  vancomycin (VANCOCIN) IVPB 1000 mg/200 mL premix  Status:  Discontinued     1,000 mg 200 mL/hr over 60 Minutes Intravenous  Once 08/14/16 0834 08/14/16 0904   08/14/16 0200  ceFAZolin (ANCEF) IVPB 1 g/50 mL premix  Status:  Discontinued     1 g 100 mL/hr over 30 Minutes Intravenous  Every 8 hours 08/14/16 0117 08/14/16 0834   08/13/16 2315  vancomycin (VANCOCIN) IVPB 1000 mg/200 mL premix     1,000 mg 200 mL/hr over 60 Minutes Intravenous  Once 08/13/16 2313 08/14/16 [id number]        Subjective:   Clarence Garrett was seen and examined today. Complaining of pain in the right forearm 8/10, throbbing, no nausea or vomiting. Patient denies dizziness, chest pain, shortness of breath, abdominal pain, N/V/D/C, new weakness, numbess, tingling. No acute events overnight.    Objective:   Vitals:   08/14/16 0200 08/14/16 0300 08/14/16 0345 08/14/16 0900  BP: 125/69 118/69 109/66   Pulse: 78 67 80   Resp:  20    Temp:      SpO2: 96% 93% 99%   Weight:    98 kg (216 lb)  Height:    6\' 1"  (1.854 m)    Intake/Output Summary (Last 24 hours) at 08/14/16 1202 Last data filed at 08/14/16 1155  Gross per 24 hour  Intake              350 ml  Output                0 ml  Net              350 ml     Wt Readings from Last 3  Encounters:  08/14/16 98 kg (216 lb)     Exam  General: Alert and oriented x 3, NAD  HEENT:   Neck: Supple, no JVD, no masses  Cardiovascular: S1 S2 auscultated, no rubs, murmurs or gallops. Regular rate and rhythm.  Respiratory: Clear to auscultation bilaterally, no wheezing, rales or rhonchi  Gastrointestinal: Soft, nontender, nondistended, + bowel sounds  Ext: no cyanosis clubbing or edema. Right arm with multiple tattoos and erythema, edema and tenderness, in the vicinity of the elbow joint with ~3 cm tender indurated area  Neuro: AAOx3, Cr N's II- XII. Strength 5/5 upper and lower extremities bilaterally  Skin: Multiple tattoos, small scrape near the right elbow  Psych: Normal affect and demeanor, alert and oriented x3    Data Reviewed:  I have personally reviewed following labs and imaging studies  Micro Results No results found for this or any previous visit (from the past 240 hour(s)).  Radiology Reports Ct Forearm Right W Contrast  Result Date: 08/14/2016 CLINICAL DATA:  Erythema of the forearm.  Possible abscess. EXAM: CT OF THE UPPER RIGHT EXTREMITY WITH CONTRAST TECHNIQUE: Multidetector CT imaging of the upper right extremity was performed according to the standard protocol following intravenous contrast administration. COMPARISON:  None. CONTRAST:  100mL ISOVUE-300 IOPAMIDOL (ISOVUE-300) INJECTION 61%<Contrast>16300mL ISOVUE-300 IOPAMIDOL (ISOVUE-300) INJECTION 61% FINDINGS: Bones/Joint/Cartilage No bony destructive findings characteristic of osteomyelitis. No elbow joint effusion. Ligaments Suboptimally assessed by CT. Muscles and Tendons Edema within along the margins of the distal biceps muscle due to the abscess. There is also fascia all edema tracking adjacent to the pronator teres and brachialis muscles in the vicinity abscess. Soft tissues 4.3 by 2.3 by 2.2 cm (volume = 11 cm^3) rim enhancing abscess along the distal medial margin of the biceps and interposed  between the biceps, pronator teres, and brachialis muscles. This abscess is directly in the vicinity of the median cubital vein which is presumably thrombosed. There is some stranding in the vicinity of the adjacent brachial artery and median nerve, as well as inflammatory stranding along the distal upper arm. Circumferential subcutaneous edema is present in the forearm and  tracks all the way down to the level of the radiocarpal joint. Aside from the deeper fasciitis in the vicinity of the abscess itself, the edema appears to be predominantly subcutaneous and no other abscesses are identified. IMPRESSION: 1. Abscess in the vicinity of the median cubital vein just proximal to the elbow, which is thrombosed or occluded. The 11 cc abscess tracks along the distal medial margin of the biceps muscle and interposed between the biceps, pronator teres, and brachialis muscles. There is local fascia all edema around the abscess along with subcutaneous edema favoring cellulitis tracks in length distally in the forearm to the level of the wrist. No elbow joint effusion or CT findings of osteomyelitis. Electronically Signed   By: Gaylyn Rong M.D.   On: 08/14/2016 10:39    Lab Data:  CBC:  Recent Labs Lab 08/13/16 2327  WBC 11.6*  NEUTROABS 7.7  HGB 14.6  HCT 41.1  MCV 88.0  PLT 188   Basic Metabolic Panel:  Recent Labs Lab 08/13/16 2327  NA 135  K 4.4  CL 98*  CO2 27  GLUCOSE 91  BUN 9  CREATININE 1.00  CALCIUM 8.9   GFR: Estimated Creatinine Clearance: 136.7 mL/min (by C-G formula based on SCr of 1 mg/dL). Liver Function Tests: No results for input(s): AST, ALT, ALKPHOS, BILITOT, PROT, ALBUMIN in the last 168 hours. No results for input(s): LIPASE, AMYLASE in the last 168 hours. No results for input(s): AMMONIA in the last 168 hours. Coagulation Profile: No results for input(s): INR, PROTIME in the last 168 hours. Cardiac Enzymes: No results for input(s): CKTOTAL, CKMB, CKMBINDEX,  TROPONINI in the last 168 hours. BNP (last 3 results) No results for input(s): PROBNP in the last 8760 hours. HbA1C: No results for input(s): HGBA1C in the last 72 hours. CBG: No results for input(s): GLUCAP in the last 168 hours. Lipid Profile: No results for input(s): CHOL, HDL, LDLCALC, TRIG, CHOLHDL, LDLDIRECT in the last 72 hours. Thyroid Function Tests: No results for input(s): TSH, T4TOTAL, FREET4, T3FREE, THYROIDAB in the last 72 hours. Anemia Panel: No results for input(s): VITAMINB12, FOLATE, FERRITIN, TIBC, IRON, RETICCTPCT in the last 72 hours. Urine analysis: No results found for: COLORURINE, APPEARANCEUR, LABSPEC, PHURINE, GLUCOSEU, HGBUR, BILIRUBINUR, KETONESUR, PROTEINUR, UROBILINOGEN, NITRITE, Hurshel Party M.D. [facility] 08/14/2016, 12:02 PM  Pager: [phone] Between 7am to 7pm - call Pager - [phone]  After 7pm go to [email or web address] - password [email or web address]  Call night coverage person covering after 7pm

## 2016-08-14 NOTE — Transfer of Care (Signed)
Immediate Anesthesia Transfer of Care Note  Patient: Clarence Garrett  Procedure(s) Performed: Procedure(s): IRRIGATION AND DEBRIDEMENT EXTREMITY FOREARM (Right)  Patient Location: PACU  Anesthesia Type:General  Level of Consciousness: awake, alert  and oriented  Airway & Oxygen Therapy: Patient Spontanous Breathing and Patient connected to nasal cannula oxygen  Post-op Assessment: Report given to RN and Post -op Vital signs reviewed and stable  Post vital signs: Reviewed and stable  Last Vitals:  Vitals:   08/14/16 1350 08/14/16 1700  BP: 118/68 (!) 131/91  Pulse: 64 86  Resp: 17 18  Temp: 37.2 C 36.9 C    Last Pain:  Vitals:   08/14/16 1700  TempSrc:   PainSc: 8          Complications: No apparent anesthesia complications

## 2016-08-15 ENCOUNTER — Inpatient Hospital Stay (HOSPITAL_COMMUNITY): Payer: No Typology Code available for payment source | Admitting: Certified Registered Nurse Anesthetist

## 2016-08-15 ENCOUNTER — Encounter (HOSPITAL_COMMUNITY): Payer: Self-pay | Admitting: Orthopedic Surgery

## 2016-08-15 DIAGNOSIS — Z9889 Other specified postprocedural states: Secondary | ICD-10-CM

## 2016-08-15 DIAGNOSIS — B9561 Methicillin susceptible Staphylococcus aureus infection as the cause of diseases classified elsewhere: Secondary | ICD-10-CM

## 2016-08-15 DIAGNOSIS — F17210 Nicotine dependence, cigarettes, uncomplicated: Secondary | ICD-10-CM

## 2016-08-15 DIAGNOSIS — Z8249 Family history of ischemic heart disease and other diseases of the circulatory system: Secondary | ICD-10-CM

## 2016-08-15 DIAGNOSIS — L02413 Cutaneous abscess of right upper limb: Secondary | ICD-10-CM

## 2016-08-15 DIAGNOSIS — F1921 Other psychoactive substance dependence, in remission: Secondary | ICD-10-CM

## 2016-08-15 LAB — HEPATITIS PANEL, ACUTE
Hep A IgM: NEGATIVE
Hep B C IgM: NEGATIVE
Hepatitis B Surface Ag: NEGATIVE

## 2016-08-15 LAB — CBC
HCT: 41.3 % (ref 39.0–52.0)
Hemoglobin: 13.9 g/dL (ref 13.0–17.0)
MCH: 30.1 pg (ref 26.0–34.0)
MCHC: 33.7 g/dL (ref 30.0–36.0)
MCV: 89.4 fL (ref 78.0–100.0)
Platelets: 218 10*3/uL (ref 150–400)
RBC: 4.62 MIL/uL (ref 4.22–5.81)
RDW: 12.5 % (ref 11.5–15.5)
WBC: 11.9 10*3/uL — AB (ref 4.0–10.5)

## 2016-08-15 LAB — BASIC METABOLIC PANEL
ANION GAP: 6 (ref 5–15)
BUN: 7 mg/dL (ref 6–20)
CALCIUM: 8.5 mg/dL — AB (ref 8.9–10.3)
CO2: 25 mmol/L (ref 22–32)
Chloride: 105 mmol/L (ref 101–111)
Creatinine, Ser: 1.1 mg/dL (ref 0.61–1.24)
Glucose, Bld: 131 mg/dL — ABNORMAL HIGH (ref 65–99)
Potassium: 3.9 mmol/L (ref 3.5–5.1)
SODIUM: 136 mmol/L (ref 135–145)

## 2016-08-15 LAB — HIV ANTIBODY (ROUTINE TESTING W REFLEX): HIV SCREEN 4TH GENERATION: NONREACTIVE

## 2016-08-15 LAB — RAPID URINE DRUG SCREEN, HOSP PERFORMED
AMPHETAMINES: NOT DETECTED
Barbiturates: NOT DETECTED
Benzodiazepines: POSITIVE — AB
Cocaine: NOT DETECTED
OPIATES: POSITIVE — AB
Tetrahydrocannabinol: NOT DETECTED

## 2016-08-15 MED ORDER — DIPHENHYDRAMINE HCL 50 MG/ML IJ SOLN
25.0000 mg | INTRAMUSCULAR | Status: DC | PRN
  Administered 2016-08-15: 25 mg via INTRAVENOUS
  Filled 2016-08-15: qty 1

## 2016-08-15 MED ORDER — OXYCODONE-ACETAMINOPHEN 5-325 MG PO TABS
1.0000 | ORAL_TABLET | ORAL | Status: DC | PRN
  Administered 2016-08-15 – 2016-08-17 (×6): 2 via ORAL
  Filled 2016-08-15 (×6): qty 2

## 2016-08-15 MED ORDER — POLYETHYLENE GLYCOL 3350 17 G PO PACK: 17.0000 g | PACK | Freq: Every day | ORAL | Status: DC | PRN

## 2016-08-15 MED ORDER — ONDANSETRON HCL 4 MG/2ML IJ SOLN: 4.0000 mg | Freq: Four times a day (QID) | INTRAMUSCULAR | Status: DC | PRN

## 2016-08-15 MED ORDER — PHENOL 1.4 % MT LIQD
2.0000 | OROMUCOSAL | Status: DC | PRN
  Filled 2016-08-15: qty 177

## 2016-08-15 MED ORDER — SENNOSIDES-DOCUSATE SODIUM 8.6-50 MG PO TABS
1.0000 | ORAL_TABLET | Freq: Two times a day (BID) | ORAL | Status: DC
  Administered 2016-08-15 – 2016-08-17 (×5): 1 via ORAL
  Filled 2016-08-15 (×5): qty 1

## 2016-08-15 NOTE — Consult Note (Signed)
[facility]       Reason for Consult: abscess, hepatitis C    Referring Physician: Montez Morita, PA  Principal Problem:   Cellulitis of right forearm Active Problems:   Cellulitis   . nicotine  21 mg Transdermal Daily  . senna-docusate  1 tablet Oral BID  . vancomycin  1,000 mg Intravenous Q8H    Recommendations: Stop zosyn Continue vancomycin Narrow to appropriate oral therapy (keflex, doxy or bactrim) when sensitivities out Can treat for 2 weeks  if HCV viral load confirmed, he can be treated as an outpatient I will send other associated labs in am I will arrange follow up with me  Thanks for consultation  Assessment: He has an arm abscess and likely chronic hepatitis C.  HCV viral load to confirm if active.  20% clear spontaneously.  HIV negative    Antibiotics: Vancomycin and zosyn  HPI: Clarence Garrett is a 26 y.o. 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  Came in with pain, swelling and taken to OR by Dr. Carola Frost for I and D. Culture growing Staph aureus.  Denies recent injection in the area. No associated diarrhea.  Now with rash on abdomen, feet and wrists, itchy.     Review of Systems:  Constitutional: negative for fevers, chills and fatigue Cardiovascular: negative for chest pressure/discomfort Gastrointestinal: negative for diarrhea Integument/breast: positive for rash All other systems reviewed and are negative    PMH: none  Social History  Substance Use Topics  . Smoking status: Current Every Day Smoker    Packs/day: 1.00    Types: Cigarettes  . Smokeless tobacco: Not on file  . Alcohol use No  previous IVDU with heroin. Works Scientist, physiological  Family History  Problem Relation Age of Onset  . Deep vein thrombosis Father     spontaneous   . Deep vein thrombosis Paternal Grandmother     spontaneous      No Known Allergies  Physical Exam: Constitutional: in no apparent distress  Vitals:   08/15/16 0505 08/15/16 1349  BP: 131/64 137/78  Pulse: 86 91  Resp: 18 18  Temp: 99.1 F (37.3 C) 98.4 F (36.9 C)   EYES: anicteric ENMT: no thrush Cardiovascular: Cor RRR Respiratory: CTA B; normal respiratoryeffort GI: Bowel sounds are normal, liver is not enlarged, spleen is not enlarged Musculoskeletal: no pedal edema noted Skin: negatives: papillary areas on abdomen, wrist, pruritic Neuro: non focal  Lab Results  Component Value Date   WBC 11.9 (H) 08/15/2016   HGB 13.9 08/15/2016   HCT 41.3 08/15/2016   MCV 89.4 08/15/2016   PLT 218 08/15/2016    Lab Results  Component Value Date   CREATININE 1.10 08/15/2016   BUN 7 08/15/2016   NA 136 08/15/2016   K 3.9 08/15/2016   CL 105 08/15/2016   CO2 25 08/15/2016   No results found for: ALT, AST, GGT, ALKPHOS   Microbiology: Recent Results (from the past 240 hour(s))  Aerobic/Anaerobic Culture (surgical/deep wound)     Status: None (Preliminary result)   Collection Time: 08/14/16  4:11 PM  Result Value Ref Range Status   Specimen Description ABSCESS  Final   Special Requests NONE  Final   Gram Stain   Final    FEW WBC PRESENT, PREDOMINANTLY PMN MODERATE GRAM POSITIVE COCCI IN PAIRS    Culture ABUNDANT  STAPHYLOCOCCUS AUREUS  Final   Report Status PENDING  Incomplete    COMEMolly Maduro, Tammala Weider, MD [facility] [email or web address] [phone] pager  [phone] cell 08/15/2016, 3:02 PM

## 2016-08-15 NOTE — Progress Notes (Signed)
Dr. Isidoro Donningai notified patient developed rash on abdomen, foot and hand. Orders received. Will continue to monitor patient.

## 2016-08-15 NOTE — Progress Notes (Signed)
[facility] Progress Note  Subjective  Doing better this am Pain improved R arm sore but not like it was preop Good appetite   Pt started getting tattoos at age of 26. States that his friend owns [place] reputable [place] After further discussions it did admit to using heroin very heavily. He states he last used about 4 years ago. He would share needles. Denies any additional drug use other than cigarettes and occasional EtOH  Review of Systems  Constitutional: Negative for chills and fever.  Respiratory: Negative for shortness of breath and wheezing.   Cardiovascular: Negative for chest pain and palpitations.  Gastrointestinal: Negative for abdominal pain, nausea and vomiting.  Neurological: Negative for tingling and sensory change.     Objective  Tmax 102.4 at 0200 today  BP 131/64 (BP Location: Left Arm)   Pulse 86   Temp 99.1 F (37.3 C) (Oral)   Resp 18   Ht 6\' 1"  (1.854 m)   Wt 97.9 kg (215 lb 13.3 oz)   SpO2 97%   BMI 28.48 kg/m   Intake/Output      01/25 0701 - 01/26 0700 01/26 0701 - 01/27 0700   P.O. 480    I.V. (mL/kg) 1980 (20.2)    IV Piggyback 800    Total Intake(mL/kg) 3260 (33.3)    Urine (mL/kg/hr) 0 (0)    Blood 25 (0)    Total Output 25     Net +3235          Urine Occurrence 4 x    Stool Occurrence 1 x      Labs  Results for Clarence Garrett, Clarence Garrett (MRN [id number]) as of 08/15/2016 13:16  Ref. Range 08/14/2016 18:38  Hep A Ab, IgM Latest Ref Range: Negative  Negative  Hepatitis B Surface Ag Latest Ref Range: Negative  Negative  Hep B Core Ab, IgM Latest Ref Range: Negative  Negative  HCV Ab Latest Ref Range: 0.0 - 0.9 s/co ratio >11.0 (H)  HIV Latest Ref Range: Non Reactive  Non Reactive   Aerobic/Anaerobic Culture (surgical/deep wound)  Order: [id number]  Status:  Preliminary result   Visible to patient:  No (Not Released) Next appt:  None    1d ago   Specimen Description ABSCESS   Special Requests NONE   Gram Stain  FEW WBC PRESENT, PREDOMINANTLY PMN  MODERATE GRAM POSITIVE COCCI IN PAIRS      Culture ABUNDANT STAPHYLOCOCCUS AUREUS   Report Status PENDING   Resulting Agency [facility]    Specimen Collected: 08/14/16 16:11 Last Resulted: 08/15/16 11:24         Results for Clarence Garrett, Clarence Garrett (MRN [id number]) as of 08/15/2016 13:16  Ref. Range 08/14/2016 09:08  CRP Latest Ref Range: <1.0 mg/dL 7.8 (H)   Results for Clarence Garrett, Clarence Garrett (MRN [id number]) as of 08/15/2016 13:16  Ref. Range 08/13/2016 23:27  Sed Rate Latest Ref Range: 0 - 16 mm/hr 30 (H)    Exam  Gen: resting comfortably in bed, NAD Ext:       Right upper extremity   Dressing removed  Mild erythema medial elbow- distal upper arm and proximal forearm   Penrose drains in place. No purulence noted. Removed proximal drain   R/U/M sensation intact  R/U/M/AIN/PIN motor intact  Ext warm  No streaking erythema  Swelling stable       Assessment and Plan   POD/HD#: 1  26 y/o white male w/o PMHx with acute abscess R antecubital soft tissue    -abscess  R antecubital space              gram stain shows staph aureus  Awaiting sensitivities   Dressing changed   Do not think he will need to return to OR for subsequent I&D  Will have dressing changed tomorrow   Will make NPO after MN in the event it is felt he needs to return for Repeat I&D  Clinically pt feels much better   Consulted ID   - + HCV ab screen   Have consulted ID and I have discussed with pt    HCV RNA ordered and is in process    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

## 2016-08-15 NOTE — Progress Notes (Signed)
[facility]                                                                              Patient Demographics  Clarence Garrett, is a 26 y.o. male, DOB - 1991/06/11, ZOX:[id number]  Admit date - 08/13/2016   Admitting Physician Hillary Bow, DO  Outpatient Primary MD for the patient is No primary care provider on file.  Outpatient specialists:   LOS - 1  days    Chief Complaint  Patient presents with  . Arm Swelling       Brief summary  Patient is a 26 year old male with no significant past medical history presented with right forearm swelling for the past 3  days, with 8/10 throbbing pain, erythema. 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 His tetanus shot is up-to-date.    Assessment & Plan    Principal Problem:   Cellulitis And abscess of right forearm: Postop day #1 - CT forearm  showed abscess in the vicinity of the median cubital vein, 4.3 x 2.3 x 2.2 cm  rim enhancing abscess along the distal margin of the biceps and interposed between the biceps, pronator teres and brachialis muscle - Cont IV vancomycin and Zosyn -Status post incision and drainage of the right elbow decubital abscess, follow cultures - Continue pain control  Code Status:  Full  DVT Prophylaxis:   SCD's Family Communication: Discussed in detail with the patient, all imaging results, lab results explained to the patient   Disposition Plan:   Time Spent in minutes 15  minutes  Procedures:  CT right forearm  Consultants:   Orthopedics, Dr. Carola Frost  Antimicrobials:   IV vancomycin 1/25  IV Zosyn 1/25   Medications  Scheduled Meds: . nicotine  21 mg Transdermal Daily  . piperacillin-tazobactam (ZOSYN)  IV  3.375 g Intravenous Q8H  . senna-docusate  1 tablet Oral BID  . vancomycin  1,000 mg Intravenous Q8H   Continuous Infusions:  PRN Meds:.HYDROmorphone (DILAUDID) injection, ondansetron (ZOFRAN) IV,  oxyCODONE-acetaminophen, phenol, polyethylene glycol   Antibiotics   Anti-infectives    Start     Dose/Rate Route Frequency Ordered Stop   08/14/16 0930  vancomycin (VANCOCIN) IVPB 1000 mg/200 mL premix     1,000 mg 200 mL/hr over 60 Minutes Intravenous Every 8 hours 08/14/16 0903     08/14/16 0930  piperacillin-tazobactam (ZOSYN) IVPB 3.375 g     3.375 g 12.5 mL/hr over 240 Minutes Intravenous Every 8 hours 08/14/16 0903     08/14/16 0845  piperacillin-tazobactam (ZOSYN) IVPB 3.375 g  Status:  Discontinued     3.375 g 100 mL/hr over 30 Minutes Intravenous  Once 08/14/16 0834 08/14/16 0904   08/14/16 0845  vancomycin (VANCOCIN) IVPB 1000 mg/200 mL premix  Status:  Discontinued     1,000 mg 200 mL/hr over 60 Minutes Intravenous  Once 08/14/16 0834 08/14/16 0904   08/14/16 0200  ceFAZolin (ANCEF) IVPB 1 g/50 mL premix  Status:  Discontinued     1 g 100 mL/hr over 30 Minutes Intravenous Every 8 hours 08/14/16 0117 08/14/16 0834  08/13/16 2315  vancomycin (VANCOCIN) IVPB 1000 mg/200 mL premix     1,000 mg 200 mL/hr over 60 Minutes Intravenous  Once 08/13/16 2313 08/14/16 1610        Subjective:   Clarence Garrett was seen and examined today.Pain control with the medications, no nausea or vomiting. Spiked fever overnight 102.93F . Patient denies dizziness, chest pain, shortness of breath, abdominal pain, N/V/D/C, new weakness, numbess, tingling. No acute events overnight.    Objective:   Vitals:   08/14/16 2141 08/15/16 0204 08/15/16 0300 08/15/16 0505  BP: (!) 144/79 (!) 144/79  131/64  Pulse: 80 100  86  Resp: 18 18  18   Temp: 100 F (37.8 C) (!) 102.4 F (39.1 C) 99 F (37.2 C) 99.1 F (37.3 C)  TempSrc: Oral Oral Oral Oral  SpO2: 99% 98%  97%  Weight:      Height:        Intake/Output Summary (Last 24 hours) at 08/15/16 1342 Last data filed at 08/15/16 0200  Gross per 24 hour  Intake             2960 ml  Output               25 ml  Net             2935 ml      Wt Readings from Last 3 Encounters:  08/14/16 97.9 kg (215 lb 13.3 oz)     Exam  General: Alert and oriented x 3, NAD  HEENT:   Neck: Supple, no JVD, no masses  Cardiovascular: S1 S2 auscultated, no rubs, murmurs or gallops. Regular rate and rhythm.  Respiratory: Clear to auscultation bilaterally, no wheezing, rales or rhonchi  Gastrointestinal: Soft, nontender, nondistended, + bowel sounds  Ext: no cyanosis clubbing. Dressing intact on the right arm  Neuro: no new deficits  Skin: Multiple tattoos, small scrape near the right elbow  Psych: Normal affect and demeanor, alert and oriented x3    Data Reviewed:  I have personally reviewed following labs and imaging studies  Micro Results Recent Results (from the past 240 hour(s))  Aerobic/Anaerobic Culture (surgical/deep wound)     Status: None (Preliminary result)   Collection Time: 08/14/16  4:11 PM  Result Value Ref Range Status   Specimen Description ABSCESS  Final   Special Requests NONE  Final   Gram Stain   Final    FEW WBC PRESENT, PREDOMINANTLY PMN MODERATE GRAM POSITIVE COCCI IN PAIRS    Culture ABUNDANT STAPHYLOCOCCUS AUREUS  Final   Report Status PENDING  Incomplete    Radiology Reports Ct Forearm Right W Contrast  Result Date: 08/14/2016 CLINICAL DATA:  Erythema of the forearm.  Possible abscess. EXAM: CT OF THE UPPER RIGHT EXTREMITY WITH CONTRAST TECHNIQUE: Multidetector CT imaging of the upper right extremity was performed according to the standard protocol following intravenous contrast administration. COMPARISON:  None. CONTRAST:  ISOVUE-300 IOPAMIDOL (ISOVUE-300) INJECTION 61%<Contrast>127mL ISOVUE-300 IOPAMIDOL (ISOVUE-300) INJECTION 61% FINDINGS: Bones/Joint/Cartilage No bony destructive findings characteristic of osteomyelitis. No elbow joint effusion. Ligaments Suboptimally assessed by CT. Muscles and Tendons Edema within along the margins of the distal biceps muscle due to the abscess.  There is also fascia all edema tracking adjacent to the pronator teres and brachialis muscles in the vicinity abscess. Soft tissues 4.3 by 2.3 by 2.2 cm (volume = 11 cm^3) rim enhancing abscess along the distal medial margin of the biceps and interposed between the biceps, pronator teres, and brachialis muscles.  This abscess is directly in the vicinity of the median cubital vein which is presumably thrombosed. There is some stranding in the vicinity of the adjacent brachial artery and median nerve, as well as inflammatory stranding along the distal upper arm. Circumferential subcutaneous edema is present in the forearm and tracks all the way down to the level of the radiocarpal joint. Aside from the deeper fasciitis in the vicinity of the abscess itself, the edema appears to be predominantly subcutaneous and no other abscesses are identified. IMPRESSION: 1. Abscess in the vicinity of the median cubital vein just proximal to the elbow, which is thrombosed or occluded. The 11 cc abscess tracks along the distal medial margin of the biceps muscle and interposed between the biceps, pronator teres, and brachialis muscles. There is local fascia all edema around the abscess along with subcutaneous edema favoring cellulitis tracks in length distally in the forearm to the level of the wrist. No elbow joint effusion or CT findings of osteomyelitis. Electronically Signed   By: Gaylyn RongWalter  Liebkemann M.D.   On: 08/14/2016 10:39    Lab Data:  CBC:  Recent Labs Lab 08/13/16 2327 08/15/16 0436  WBC 11.6* 11.9*  NEUTROABS 7.7  --   HGB 14.6 13.9  HCT 41.1 41.3  MCV 88.0 89.4  PLT 188 218   Basic Metabolic Panel:  Recent Labs Lab 08/13/16 2327 08/15/16 0436  NA 135 136  K 4.4 3.9  CL 98* 105  CO2 27 25  GLUCOSE 91 131*  BUN 9 7  CREATININE 1.00 1.10  CALCIUM 8.9 8.5*   GFR: Estimated Creatinine Clearance: 124.3 mL/min (by C-G formula based on SCr of 1.1 mg/dL). Liver Function Tests: No results for  input(s): AST, ALT, ALKPHOS, BILITOT, PROT, ALBUMIN in the last 168 hours. No results for input(s): LIPASE, AMYLASE in the last 168 hours. No results for input(s): AMMONIA in the last 168 hours. Coagulation Profile:  Recent Labs Lab 08/14/16 1838  INR 1.09   Cardiac Enzymes: No results for input(s): CKTOTAL, CKMB, CKMBINDEX, TROPONINI in the last 168 hours. BNP (last 3 results) No results for input(s): PROBNP in the last 8760 hours. HbA1C: No results for input(s): HGBA1C in the last 72 hours. CBG: No results for input(s): GLUCAP in the last 168 hours. Lipid Profile: No results for input(s): CHOL, HDL, LDLCALC, TRIG, CHOLHDL, LDLDIRECT in the last 72 hours. Thyroid Function Tests: No results for input(s): TSH, T4TOTAL, FREET4, T3FREE, THYROIDAB in the last 72 hours. Anemia Panel: No results for input(s): VITAMINB12, FOLATE, FERRITIN, TIBC, IRON, RETICCTPCT in the last 72 hours. Urine analysis: No results found for: COLORURINE, APPEARANCEUR, LABSPEC, PHURINE, GLUCOSEU, HGBUR, BILIRUBINUR, KETONESUR, PROTEINUR, UROBILINOGEN, NITRITE, [place] PartyLEUKOCYTESUR   RAI,RIPUDEEP M.D. [facility] 08/15/2016, 1:42 PM  Pager: [phone] Between 7am to 7pm - call Pager - [phone]  After 7pm go to [email or web address] - password [email or web address]  Call night coverage person covering after 7pm

## 2016-08-16 ENCOUNTER — Encounter (HOSPITAL_COMMUNITY): Payer: Self-pay | Admitting: Certified Registered Nurse Anesthetist

## 2016-08-16 ENCOUNTER — Encounter (HOSPITAL_COMMUNITY)
Admission: EM | Disposition: A | Discharge status: Home / Self Care | Payer: Self-pay | Source: Home / Self Care | Attending: Internal Medicine

## 2016-08-16 DIAGNOSIS — L03113 Cellulitis of right upper limb: Principal | ICD-10-CM

## 2016-08-16 LAB — COMPREHENSIVE METABOLIC PANEL
ALK PHOS: 63 U/L (ref 38–126)
ALT: 33 U/L (ref 17–63)
ANION GAP: 8 (ref 5–15)
AST: 20 U/L (ref 15–41)
Albumin: 3 g/dL — ABNORMAL LOW (ref 3.5–5.0)
BILIRUBIN TOTAL: 0.7 mg/dL (ref 0.3–1.2)
BUN: 8 mg/dL (ref 6–20)
CO2: 23 mmol/L (ref 22–32)
Calcium: 8.9 mg/dL (ref 8.9–10.3)
Chloride: 106 mmol/L (ref 101–111)
Creatinine, Ser: 0.92 mg/dL (ref 0.61–1.24)
GFR calc Af Amer: >60 mL/min (ref >60)
GLUCOSE: 124 mg/dL — AB (ref 65–99)
POTASSIUM: 4.1 mmol/L (ref 3.5–5.1)
Sodium: 137 mmol/L (ref 135–145)
TOTAL PROTEIN: 6.6 g/dL (ref 6.5–8.1)

## 2016-08-16 LAB — CBC
HEMATOCRIT: 48.5 % (ref 39.0–52.0)
HEMOGLOBIN: 17 g/dL (ref 13.0–17.0)
MCH: 31 pg (ref 26.0–34.0)
MCHC: 35.1 g/dL (ref 30.0–36.0)
MCV: 88.3 fL (ref 78.0–100.0)
Platelets: 175 10*3/uL (ref 150–400)
RBC: 5.49 MIL/uL (ref 4.22–5.81)
RDW: 12.5 % (ref 11.5–15.5)
WBC: 8.8 10*3/uL (ref 4.0–10.5)

## 2016-08-16 SURGERY — IRRIGATION AND DEBRIDEMENT EXTREMITY
Anesthesia: General | Laterality: Right

## 2016-08-16 MED ORDER — DOXYCYCLINE HYCLATE 100 MG PO TABS
100.0000 mg | ORAL_TABLET | Freq: Two times a day (BID) | ORAL | Status: DC
  Administered 2016-08-16 – 2016-08-17 (×3): 100 mg via ORAL
  Filled 2016-08-16 (×3): qty 1

## 2016-08-16 MED ORDER — MIDAZOLAM HCL 2 MG/2ML IJ SOLN
INTRAMUSCULAR | Status: AC
  Filled 2016-08-16: qty 2

## 2016-08-16 MED ORDER — FENTANYL CITRATE (PF) 100 MCG/2ML IJ SOLN
INTRAMUSCULAR | Status: AC
  Filled 2016-08-16: qty 4

## 2016-08-16 MED ORDER — DOXYCYCLINE HYCLATE 100 MG PO TABS: 100.0000 mg | ORAL_TABLET | Freq: Two times a day (BID) | ORAL | 0 refills | Status: AC

## 2016-08-16 MED ORDER — HYDROMORPHONE HCL 2 MG/ML IJ SOLN: 1.0000 mg | INTRAMUSCULAR | 0 refills | Status: DC | PRN

## 2016-08-16 MED ORDER — SENNOSIDES-DOCUSATE SODIUM 8.6-50 MG PO TABS: 1.0000 | ORAL_TABLET | Freq: Two times a day (BID) | ORAL | 0 refills | Status: AC

## 2016-08-16 MED ORDER — NICOTINE 21 MG/24HR TD PT24: 21.0000 mg | MEDICATED_PATCH | Freq: Every day | TRANSDERMAL | 0 refills | Status: AC

## 2016-08-16 MED ORDER — PROPOFOL 10 MG/ML IV BOLUS
INTRAVENOUS | Status: AC
  Filled 2016-08-16: qty 20

## 2016-08-16 NOTE — Progress Notes (Signed)
[facility]                                                                              Patient Demographics  Clarence Garrett, is a 26 y.o. male, DOB - 1991/04/09, RUE:[id number]  Admit date - 08/13/2016   Admitting Physician Hillary Bow, DO  Outpatient Primary MD for the patient is No primary care provider on file.  Outpatient specialists:   LOS - 2  days    Chief Complaint  Patient presents with  . Arm Swelling       Brief summary  Patient is a 26 year old male with no significant past medical history presented with right forearm swelling for the past 3  days, with 8/10 throbbing pain, erythema. 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 His tetanus shot is up-to-date.    Assessment & Plan    Principal Problem:   Cellulitis And abscess of right forearm: Postop day #1 - CT forearm  showed abscess in the vicinity of the median cubital vein, 4.3 x 2.3 x 2.2 cm  rim enhancing abscess along the distal margin of the biceps and interposed between the biceps, pronator teres and brachialis muscle - on IV vancomycin and Zosyn, now on IV vancomycin I would change to doxycycline with a sensitivity. -Status post incision and drainage of the right elbow decubital abscess, follow cultures - pain control per orthopedics. - Awaiting the recommendations from orthopedics regarding further management. No further medical issues at present.  Code Status:  Full  DVT Prophylaxis:   SCD's Family Communication: Discussed in detail with the patient, all imaging results, lab results explained to the patient   Disposition Plan:   Time Spent in minutes 15  minutes  Procedures:  CT right forearm  Consultants:   Orthopedics, Dr. Carola Frost  Antimicrobials:   IV vancomycin 1/25  IV Zosyn 1/25   Medications  Scheduled Meds: . doxycycline  100 mg Oral Q12H  . nicotine  21 mg Transdermal Daily  . senna-docusate  1 tablet Oral  BID   Continuous Infusions:  PRN Meds:.diphenhydrAMINE, HYDROmorphone (DILAUDID) injection, ondansetron (ZOFRAN) IV, oxyCODONE-acetaminophen, phenol, polyethylene glycol   Antibiotics   Anti-infectives    Start     Dose/Rate Route Frequency Ordered Stop   08/16/16 1400  doxycycline (VIBRA-TABS) tablet 100 mg     100 mg Oral Every 12 hours 08/16/16 1306     08/16/16 0000  doxycycline (VIBRA-TABS) 100 MG tablet     100 mg Oral Every 12 hours 08/16/16 1342     08/14/16 0930  vancomycin (VANCOCIN) IVPB 1000 mg/200 mL premix  Status:  Discontinued     1,000 mg 200 mL/hr over 60 Minutes Intravenous Every 8 hours 08/14/16 0903 08/16/16 1306   08/14/16 0930  piperacillin-tazobactam (ZOSYN) IVPB 3.375 g  Status:  Discontinued     3.375 g 12.5 mL/hr over 240 Minutes Intravenous Every 8 hours 08/14/16 0903 08/15/16 1447   08/14/16 0845  piperacillin-tazobactam (ZOSYN) IVPB 3.375 g  Status:  Discontinued     3.375 g 100 mL/hr over 30 Minutes Intravenous  Once 08/14/16  0981 08/14/16 0904   08/14/16 0845  vancomycin (VANCOCIN) IVPB 1000 mg/200 mL premix  Status:  Discontinued     1,000 mg 200 mL/hr over 60 Minutes Intravenous  Once 08/14/16 0834 08/14/16 0904   08/14/16 0200  ceFAZolin (ANCEF) IVPB 1 g/50 mL premix  Status:  Discontinued     1 g 100 mL/hr over 30 Minutes Intravenous Every 8 hours 08/14/16 0117 08/14/16 0834   08/13/16 2315  vancomycin (VANCOCIN) IVPB 1000 mg/200 mL premix     1,000 mg 200 mL/hr over 60 Minutes Intravenous  Once 08/13/16 2313 08/14/16 0306        Subjective:  Feeling better, only complains of constipation.  Objective:   Vitals:   08/15/16 2114 08/16/16 0534 08/16/16 0944 08/16/16 1308  BP: 128/61 121/66 128/71 (!) 109/54  Pulse: 76 (!) 53 89 60  Resp: 18 19 19 19   Temp: 99.6 F (37.6 C) 97.9 F (36.6 C) 98.3 F (36.8 C) 98.6 F (37 C)  TempSrc: Oral Oral Oral Oral  SpO2: 99% 100% 100% 97%  Weight:      Height:        Intake/Output Summary  (Last 24 hours) at 08/16/16 1706 Last data filed at 08/16/16 0059  Gross per 24 hour  Intake              240 ml  Output                0 ml  Net              240 ml     Wt Readings from Last 3 Encounters:  08/14/16 97.9 kg (215 lb 13.3 oz)     Exam  General: Alert and oriented x 3, NAD  HEENT:   Neck: Supple, no JVD, no masses  Cardiovascular: S1 S2 auscultated, no rubs, murmurs or gallops. Regular rate and rhythm.  Respiratory: Clear to auscultation bilaterally, no wheezing, rales or rhonchi  Gastrointestinal: Soft, nontender, nondistended, + bowel sounds  Ext: no cyanosis clubbing. Dressing intact on the right arm  Neuro: no new deficits  Skin: Multiple tattoos, small scrape near the right elbow  Psych: Normal affect and demeanor, alert and oriented x3    Data Reviewed:  I have personally reviewed following labs and imaging studies  Micro Results Recent Results (from the past 240 hour(s))  Aerobic/Anaerobic Culture (surgical/deep wound)     Status: None (Preliminary result)   Collection Time: 08/14/16  4:11 PM  Result Value Ref Range Status   Specimen Description ABSCESS  Final   Special Requests NONE  Final   Gram Stain   Final    FEW WBC PRESENT, PREDOMINANTLY PMN MODERATE GRAM POSITIVE COCCI IN PAIRS    Culture   Final    ABUNDANT METHICILLIN RESISTANT STAPHYLOCOCCUS AUREUS NO ANAEROBES ISOLATED; CULTURE IN PROGRESS FOR 5 DAYS    Report Status PENDING  Incomplete   Organism ID, Bacteria METHICILLIN RESISTANT STAPHYLOCOCCUS AUREUS  Final      Susceptibility   Methicillin resistant staphylococcus aureus - MIC*    CIPROFLOXACIN >=8 RESISTANT Resistant     ERYTHROMYCIN >=8 RESISTANT Resistant     GENTAMICIN <=0.5 SENSITIVE Sensitive     OXACILLIN >=4 RESISTANT Resistant     TETRACYCLINE <=1 SENSITIVE Sensitive     VANCOMYCIN <=0.5 SENSITIVE Sensitive     TRIMETH/SULFA <=10 SENSITIVE Sensitive     CLINDAMYCIN <=0.25 SENSITIVE Sensitive     RIFAMPIN  <=0.5 SENSITIVE Sensitive  Inducible Clindamycin NEGATIVE Sensitive     * ABUNDANT METHICILLIN RESISTANT STAPHYLOCOCCUS AUREUS    Radiology Reports Ct Forearm Right W Contrast  Result Date: 08/14/2016 CLINICAL DATA:  Erythema of the forearm.  Possible abscess. EXAM: CT OF THE UPPER RIGHT EXTREMITY WITH CONTRAST TECHNIQUE: Multidetector CT imaging of the upper right extremity was performed according to the standard protocol following intravenous contrast administration. COMPARISON:  None. CONTRAST:  100mL ISOVUE-300 IOPAMIDOL (ISOVUE-300) INJECTION 61%<Contrast>14100mL ISOVUE-300 IOPAMIDOL (ISOVUE-300) INJECTION 61% FINDINGS: Bones/Joint/Cartilage No bony destructive findings characteristic of osteomyelitis. No elbow joint effusion. Ligaments Suboptimally assessed by CT. Muscles and Tendons Edema within along the margins of the distal biceps muscle due to the abscess. There is also fascia all edema tracking adjacent to the pronator teres and brachialis muscles in the vicinity abscess. Soft tissues 4.3 by 2.3 by 2.2 cm (volume = 11 cm^3) rim enhancing abscess along the distal medial margin of the biceps and interposed between the biceps, pronator teres, and brachialis muscles. This abscess is directly in the vicinity of the median cubital vein which is presumably thrombosed. There is some stranding in the vicinity of the adjacent brachial artery and median nerve, as well as inflammatory stranding along the distal upper arm. Circumferential subcutaneous edema is present in the forearm and tracks all the way down to the level of the radiocarpal joint. Aside from the deeper fasciitis in the vicinity of the abscess itself, the edema appears to be predominantly subcutaneous and no other abscesses are identified. IMPRESSION: 1. Abscess in the vicinity of the median cubital vein just proximal to the elbow, which is thrombosed or occluded. The 11 cc abscess tracks along the distal medial margin of the biceps muscle  and interposed between the biceps, pronator teres, and brachialis muscles. There is local fascia all edema around the abscess along with subcutaneous edema favoring cellulitis tracks in length distally in the forearm to the level of the wrist. No elbow joint effusion or CT findings of osteomyelitis. Electronically Signed   By: Gaylyn RongWalter  Liebkemann M.D.   On: 08/14/2016 10:39    Lab Data:  CBC:  Recent Labs Lab 08/13/16 2327 08/15/16 0436 08/16/16 0647  WBC 11.6* 11.9* 8.8  NEUTROABS 7.7  --   --   HGB 14.6 13.9 17.0  HCT 41.1 41.3 48.5  MCV 88.0 89.4 88.3  PLT 188 218 175   Basic Metabolic Panel:  Recent Labs Lab 08/13/16 2327 08/15/16 0436 08/16/16 1022  NA 135 136 137  K 4.4 3.9 4.1  CL 98* 105 106  CO2 27 25 23   GLUCOSE 91 131* 124*  BUN 9 7 8   CREATININE 1.00 1.10 0.92  CALCIUM 8.9 8.5* 8.9   GFR: Estimated Creatinine Clearance: 148.6 mL/min (by C-G formula based on SCr of 0.92 mg/dL). Liver Function Tests:  Recent Labs Lab 08/16/16 1022  AST 20  ALT 33  ALKPHOS 63  BILITOT 0.7  PROT 6.6  ALBUMIN 3.0*   No results for input(s): LIPASE, AMYLASE in the last 168 hours. No results for input(s): AMMONIA in the last 168 hours. Coagulation Profile:  Recent Labs Lab 08/14/16 1838  INR 1.09    Author:  Lynden OxfordPranav Akeiba Axelson, MD [facility] Pager: [phone] 08/16/2016 5:09 PM     After 7pm go to [email or web address] - password [email or web address]  Call night coverage person covering after 7pm

## 2016-08-16 NOTE — Progress Notes (Addendum)
Patient ID: Clarence HartshornDavid Garrett, male   DOB: 06/25/1991, 26 y.o.   MRN: [id number]     Subjective:  Patient reports pain as mild.  Patient reports no fever or chills and that he is much improved  Objective:   VITALS:   Vitals:   08/15/16 1349 08/15/16 2114 08/16/16 0534 08/16/16 0944  BP: 137/78 128/61 121/66 128/71  Pulse: 91 76 (!) 53 89  Resp: 18 18 19 19   Temp: 98.4 F (36.9 C) 99.6 F (37.6 C) 97.9 F (36.6 C) 98.3 F (36.8 C)  TempSrc: Oral Oral Oral Oral  SpO2: 98% 99% 100% 100%  Weight:      Height:        ABD soft Sensation intact distally Dorsiflexion/Plantar flexion intact Incision: dressing C/D/I and scant drainage Penrose drain removed with Dressing  goos hand and wrist motion  Lab Results  Component Value Date   WBC 8.8 08/16/2016   HGB 17.0 08/16/2016   HCT 48.5 08/16/2016   MCV 88.3 08/16/2016   PLT 175 08/16/2016   BMET    Component Value Date/Time   NA 137 08/16/2016 1022   K 4.1 08/16/2016 1022   CL 106 08/16/2016 1022   CO2 23 08/16/2016 1022   GLUCOSE 124 (H) 08/16/2016 1022   BUN 8 08/16/2016 1022   CREATININE 0.92 08/16/2016 1022   CALCIUM 8.9 08/16/2016 1022   GFRNONAA >60 08/16/2016 1022   GFRAA >60 08/16/2016 1022     Assessment/Plan: 2 Days Post-Op   Principal Problem:   Cellulitis of right forearm Active Problems:   Cellulitis   Advance diet Up with therapy Continue to monitor elbow but delay Surgery for now WBAT Dry dressing PRN   Haskel KhanDOUGLAS PARRY, BRANDON 08/16/2016, 12:24 PM  Discussed and reviewed clinical image sent to my by Apolinar JunesBrandon, and looks ok.  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  plan outpatient doxy for 2 weeks.  ID to manage hep c.  May dc home tomorrow if improved on orals.  Teryl LucyJoshua Johnross Nabozny, MD Cell [phone]

## 2016-08-17 LAB — HEPATITIS B SURFACE ANTIGEN: HEP B S AG: NEGATIVE

## 2016-08-17 LAB — BASIC METABOLIC PANEL
ANION GAP: 10 (ref 5–15)
BUN: 13 mg/dL (ref 6–20)
CO2: 23 mmol/L (ref 22–32)
Calcium: 9.3 mg/dL (ref 8.9–10.3)
Chloride: 106 mmol/L (ref 101–111)
Creatinine, Ser: 0.92 mg/dL (ref 0.61–1.24)
GFR calc Af Amer: >60 mL/min (ref >60)
GLUCOSE: 97 mg/dL (ref 65–99)
POTASSIUM: 4.3 mmol/L (ref 3.5–5.1)
Sodium: 139 mmol/L (ref 135–145)

## 2016-08-17 LAB — CBC
HEMATOCRIT: 44.1 % (ref 39.0–52.0)
HEMOGLOBIN: 15 g/dL (ref 13.0–17.0)
MCH: 30.2 pg (ref 26.0–34.0)
MCHC: 34 g/dL (ref 30.0–36.0)
MCV: 88.9 fL (ref 78.0–100.0)
Platelets: 251 10*3/uL (ref 150–400)
RBC: 4.96 MIL/uL (ref 4.22–5.81)
RDW: 12.2 % (ref 11.5–15.5)
WBC: 8.1 10*3/uL (ref 4.0–10.5)

## 2016-08-17 LAB — HCV RNA QUANT RFLX ULTRA OR GENOTYP
HCV RNA Qnt(log copy/mL): 5.338 log10 IU/mL
HEPATITIS C QUANTITATION: 218000 [IU]/mL

## 2016-08-17 LAB — HEPATITIS C GENOTYPE

## 2016-08-17 MED ORDER — OXYCODONE-ACETAMINOPHEN 5-325 MG PO TABS: 1.0000 | ORAL_TABLET | ORAL | 0 refills | Status: AC | PRN

## 2016-08-17 NOTE — Progress Notes (Signed)
Patient ID: Clarence HartshornDavid Garrett, male   DOB: Jun 25, 1991, 26 y.o.   MRN: [id number]     Subjective:  Patient reports pain as mild.  Patient states that this is much improved.  In bed and in no acute distress  Objective:   VITALS:   Vitals:   08/16/16 0944 08/16/16 1308 08/16/16 2055 08/17/16 0627  BP: 128/71 (!) 109/54 (!) 118/57 (!) 115/59  Pulse: 89 60 64 (!) 56  Resp: 19 19 18 18   Temp: 98.3 F (36.8 C) 98.6 F (37 C) 97.9 F (36.6 C) 97.3 F (36.3 C)  TempSrc: Oral Oral Oral Oral  SpO2: 100% 97% 97% 97%  Weight:      Height:        ABD soft Sensation intact distally Dorsiflexion/Plantar flexion intact Incision: dressing C/D/I and no drainage Dressing pulled back and redness is much improved and tenderness is as well. Full elbow motion and full wrist and hand motion  Lab Results  Component Value Date   WBC 8.1 08/17/2016   HGB 15.0 08/17/2016   HCT 44.1 08/17/2016   MCV 88.9 08/17/2016   PLT 251 08/17/2016   BMET    Component Value Date/Time   NA 139 08/17/2016 0524   K 4.3 08/17/2016 0524   CL 106 08/17/2016 0524   CO2 23 08/17/2016 0524   GLUCOSE 97 08/17/2016 0524   BUN 13 08/17/2016 0524   CREATININE 0.92 08/17/2016 0524   CALCIUM 9.3 08/17/2016 0524   GFRNONAA >60 08/17/2016 0524   GFRAA >60 08/17/2016 0524     Assessment/Plan: 3 Days Post-Op   Principal Problem:   Cellulitis of right forearm Active Problems:   Cellulitis   Advance diet Up with therapy Continue plan per medicine  Continue plan per ID WBAT Dry dressing PRN Follow up with Dr Carola FrostHandy after discharge   Haskel KhanDOUGLAS Britain Saber 08/17/2016, 11:17 AM   Teryl LucyJoshua Landau, MD Cell [phone]

## 2016-08-17 NOTE — Discharge Summary (Addendum)
[facility] Discharge Summary   Patient: Clarence Garrett YQM:[id number]:[id number]   PCP: No primary care provider on file. DOB: [birth year]   Date of admission: 08/13/2016   Date of discharge:  08/17/2016    Discharge Diagnoses:  Principal Problem:   Cellulitis of right forearm Active Problems:   Cellulitis   Admitted From: home Disposition:  home  Recommendations for Outpatient Follow-up:  1. Please follow up with Dr Carola FrostHandy for wound care  2. Follow up with infectious disease.  Follow-up Information    HANDY,MICHAEL H, MD. Schedule an appointment as soon as possible for a visit in 1 week(s).   Specialty:  Orthopedic Surgery Why:  wound care.  Contact information: [address] [place] [id number] [phone]          Diet recommendation: regular diet  Activity: The patient is advised to gradually reintroduce usual activities.  Discharge Condition: good  Code Status: full code  History of present illness: As per the H and P dictated on admission, "Clarence HartshornDavid Mayberry is a 26 y.o. male with medical history significant of previously healthy.  Patient presents to the ED with c/o R forearm swelling for the past 3 days.  Symptoms are worsening since onset.  8/10 throbbing pain.  Erythema.  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  He states his tetanus is up to date.  Tattoos on arms are not recent or new he states."  Hospital Course:   Cellulitis And abscess of right forearm: Postop day #3 - CT forearm  showed abscess in the vicinity of the median cubital vein, 4.3 x 2.3 x 2.2 cm  rim enhancing abscess along the distal margin of the biceps and interposed between the biceps, pronator teres and brachialis muscle - on IV vancomycin and Zosyn, later on IV vancomycin  - change to doxycycline with a sensitivity. - Status post incision and drainage of the right elbow decubital  abscess. - pain control per orthopedics. - D/w with Dr Dion Saucierlandau- pt stable to be discharge home on doxycycline  chronic hepatitis C. HIV negative   Follow up with Dr Luciana Axeomer in outpatient setting.  All other chronic medical condition were stable during the hospitalization.  Patient was ambulatory without any assistance. On the day of the discharge the patient's vitals were stable, and no other acute medical condition were reported by patient. the patient was felt safe to be discharge at home with family.  Procedures and Results:  I and d   Consultations:  Orthopedics  Infectious disease3  DISCHARGE MEDICATION: Current Discharge Medication List    START taking these medications   Details  doxycycline (VIBRA-TABS) 100 MG tablet Take 1 tablet (100 mg total) by mouth every 12 (twelve) hours. Qty: 28 tablet, Refills: 0    nicotine (NICODERM CQ - DOSED IN MG/24 HOURS) 21 mg/24hr patch Place 1 patch (21 mg total) onto the skin daily. Qty: 28 patch, Refills: 0    oxyCODONE-acetaminophen (PERCOCET/ROXICET) 5-325 MG tablet Take 1-2 tablets by mouth every 4 (four) hours as needed for moderate pain. Qty: 30 tablet, Refills: 0    senna-docusate (SENOKOT-S) 8.6-50 MG tablet Take 1 tablet by mouth 2 (two) times daily. Qty: 30 tablet, Refills: 0       No Known Allergies Discharge Instructions    Diet general    Complete by:  As directed    Discharge instructions  Complete by:  As directed    It is important that you read following instructions as well as go over your medication list with RN to help you understand your care after this hospitalization.  Discharge Instructions: Please follow-up with PCP in one week  Please request your primary care physician to go over all Hospital Tests and Procedure/Radiological results at the follow up,  Please get all Hospital records sent to your PCP by signing hospital release before you go home.   Do not drive, operating heavy machinery,  perform activities at heights, swimming or participation in water activities or provide baby sitting services; until you have been seen by Primary Care Physician or a Neurologist and advised to do so again. Do not take more than prescribed Pain, Sleep and Anxiety Medications. You were cared for by a hospitalist during your hospital stay. If you have any questions about your discharge medications or the care you received while you were in the hospital after you are discharged, you can call the unit and ask to speak with the hospitalist on call if the hospitalist that took care of you is not available.  Once you are discharged, your primary care physician will handle any further medical issues. Please note that NO REFILLS for any discharge medications will be authorized once you are discharged, as it is imperative that you return to your primary care physician (or establish a relationship with a primary care physician if you do not have one) for your aftercare needs so that they can reassess your need for medications and monitor your lab values. You Must read complete instructions/literature along with all the possible adverse reactions/side effects for all the Medicines you take and that have been prescribed to you. Take any new Medicines after you have completely understood and accept all the possible adverse reactions/side effects. Wear Seat belts while driving. If you have smoked or chewed Tobacco in the last 2 yrs please stop smoking and/or stop any Recreational drug use.   Discharge wound care:    Complete by:  As directed    Dry dressing PRN   Increase activity slowly    Complete by:  As directed      Discharge Exam: Filed Weights   08/14/16 0900 08/14/16 1350  Weight: 98 kg (216 lb) 97.9 kg (215 lb 13.3 oz)   Vitals:   08/17/16 0627 08/17/16 1322  BP: (!) 115/59 116/72  Pulse: (!) 56 65  Resp: 18 18  Temp: 97.3 F (36.3 C) 97.9 F (36.6 C)   General: Appear in no distress, no Rash;  Oral Mucosa moist. Cardiovascular: S1 and S2 Present, no Murmur, no JVD Respiratory: Bilateral Air entry present and Clear to Auscultation, no Crackles, no wheezes Abdomen: Bowel Sound present, Soft and no tenderness Extremities: no Pedal edema, no calf tenderness Neurology: Grossly no focal neuro deficit.  The results of significant diagnostics from this hospitalization (including imaging, microbiology, ancillary and laboratory) are listed below for reference.    Significant Diagnostic Studies: Ct Forearm Right W Contrast  Result Date: 08/14/2016 CLINICAL DATA:  Erythema of the forearm.  Possible abscess. EXAM: CT OF THE UPPER RIGHT EXTREMITY WITH CONTRAST TECHNIQUE: Multidetector CT imaging of the upper right extremity was performed according to the standard protocol following intravenous contrast administration. COMPARISON:  None. CONTRAST:  ISOVUE-300 IOPAMIDOL (ISOVUE-300) INJECTION 61%<Contrast>143mL ISOVUE-300 IOPAMIDOL (ISOVUE-300) INJECTION 61% FINDINGS: Bones/Joint/Cartilage No bony destructive findings characteristic of osteomyelitis. No elbow joint effusion. Ligaments Suboptimally assessed by CT. Muscles and Tendons  Edema within along the margins of the distal biceps muscle due to the abscess. There is also fascia all edema tracking adjacent to the pronator teres and brachialis muscles in the vicinity abscess. Soft tissues 4.3 by 2.3 by 2.2 cm (volume = 11 cm^3) rim enhancing abscess along the distal medial margin of the biceps and interposed between the biceps, pronator teres, and brachialis muscles. This abscess is directly in the vicinity of the median cubital vein which is presumably thrombosed. There is some stranding in the vicinity of the adjacent brachial artery and median nerve, as well as inflammatory stranding along the distal upper arm. Circumferential subcutaneous edema is present in the forearm and tracks all the way down to the level of the radiocarpal joint. Aside from  the deeper fasciitis in the vicinity of the abscess itself, the edema appears to be predominantly subcutaneous and no other abscesses are identified. IMPRESSION: 1. Abscess in the vicinity of the median cubital vein just proximal to the elbow, which is thrombosed or occluded. The 11 cc abscess tracks along the distal medial margin of the biceps muscle and interposed between the biceps, pronator teres, and brachialis muscles. There is local fascia all edema around the abscess along with subcutaneous edema favoring cellulitis tracks in length distally in the forearm to the level of the wrist. No elbow joint effusion or CT findings of osteomyelitis. Electronically Signed   By: Gaylyn Rong M.D.   On: 08/14/2016 10:39    Microbiology: Recent Results (from the past 240 hour(s))  Aerobic/Anaerobic Culture (surgical/deep wound)     Status: None (Preliminary result)   Collection Time: 08/14/16  4:11 PM  Result Value Ref Range Status   Specimen Description ABSCESS  Final   Special Requests NONE  Final   Gram Stain   Final    FEW WBC PRESENT, PREDOMINANTLY PMN MODERATE GRAM POSITIVE COCCI IN PAIRS    Culture   Final    ABUNDANT METHICILLIN RESISTANT STAPHYLOCOCCUS AUREUS NO ANAEROBES ISOLATED; CULTURE IN PROGRESS FOR 5 DAYS    Report Status PENDING  Incomplete   Organism ID, Bacteria METHICILLIN RESISTANT STAPHYLOCOCCUS AUREUS  Final      Susceptibility   Methicillin resistant staphylococcus aureus - MIC*    CIPROFLOXACIN >=8 RESISTANT Resistant     ERYTHROMYCIN >=8 RESISTANT Resistant     GENTAMICIN <=0.5 SENSITIVE Sensitive     OXACILLIN >=4 RESISTANT Resistant     TETRACYCLINE <=1 SENSITIVE Sensitive     VANCOMYCIN <=0.5 SENSITIVE Sensitive     TRIMETH/SULFA <=10 SENSITIVE Sensitive     CLINDAMYCIN <=0.25 SENSITIVE Sensitive     RIFAMPIN <=0.5 SENSITIVE Sensitive     Inducible Clindamycin NEGATIVE Sensitive     * ABUNDANT METHICILLIN RESISTANT STAPHYLOCOCCUS AUREUS      Labs: CBC:  Recent Labs Lab 08/13/16 2327 08/15/16 0436 08/16/16 0647 08/17/16 0524  WBC 11.6* 11.9* 8.8 8.1  NEUTROABS 7.7  --   --   --   HGB 14.6 13.9 17.0 15.0  HCT 41.1 41.3 48.5 44.1  MCV 88.0 89.4 88.3 88.9  PLT 188 218 175 251   Basic Metabolic Panel:  Recent Labs Lab 08/13/16 2327 08/15/16 0436 08/16/16 1022 08/17/16 0524  NA 135 136 137 139  K 4.4 3.9 4.1 4.3  CL 98* 105 106 106  CO2 27 25 23 23   GLUCOSE 91 131* 124* 97  BUN 9 7 8 13   CREATININE 1.00 1.10 0.92 0.92  CALCIUM 8.9 8.5* 8.9 9.3   Liver Function Tests:  Recent Labs Lab 08/16/16 1022  AST 20  ALT 33  ALKPHOS 63  BILITOT 0.7  PROT 6.6  ALBUMIN 3.0*   Time spent: 30 minutes  Signed:  Makia Bossi  [facility]  08/17/2016  , 3:04 PM

## 2016-08-17 NOTE — Progress Notes (Signed)
Pt discharged to home.  Discharge instructions explained to pt.  Pt has no questions at the time of discharge.  Pt states he has all belonging.  IV removed.  Pt ambulated off unit on his own.

## 2016-08-17 NOTE — Op Note (Signed)
NAME:  Clarence Garrett, Praneel                      ACCOUNT NO.:  MEDICAL RECORD NO.:  [id number]  LOCATION:                                 FACILITY:  PHYSICIAN:  Doralee AlbinoMichael H. Carola FrostHandy, M.D.      DATE OF BIRTH:  DATE OF PROCEDURE: DATE OF DISCHARGE:                              OPERATIVE REPORT   PREOPERATIVE DIAGNOSIS:  Right forearm antecubital abscess.  POSTOPERATIVE DIAGNOSIS:  Right forearm antecubital abscess.  PROCEDURE:  Right elbow incision and drainage of antecubital abscess, deep.  SURGEON:  Doralee AlbinoMichael H. Carola FrostHandy, M.D.  ASSISTANT:  Montez MoritaKeith Paul, PA-C.  ANESTHESIA:  General.  COMPLICATIONS:  None.  TOURNIQUET:  Please refer to the anesthesia record.  SPECIMENS:  Two anaerobic and aerobic sent to Micro.  DISPOSITION:  To PACU.  CONDITION:  Stable.  DRAINS:  Two Penrose; 1 proximal, 1 distal.  BRIEF SUMMARY OF INDICATIONS FOR PROCEDURE:  Mr. Clarence Garrett is a 26 year old right-hand dominant male who reports progressive right elbow pain, redness without any sensory or motor loss distally.  He presented to the ER initially and then returned with increasing pain.  CT scan demonstrated a well-localized abscess beneath the fascia extending both proximal and distal to the anterior elbow crease.  I discussed with the patient risks and benefits of surgical debridement including the potential for persistent infection, need for further surgery, loss of motion, scarring, DVT, PE, anesthetic complications and others.  He acknowledged these risks and did wish to proceed.  BRIEF SUMMARY OF PROCEDURE:  The patient was taken to the operating room where general anesthesia was induced.  His right upper extremity was prepped and draped in usual sterile fashion.  Tourniquet was used during the procedure.  The arm was elevated initially and then the tourniquet elevated to 250 mmHg.  No Esmarch bandage was used because of the infection.  A curvilinear 4 cm incision was made over the antecubital fossa.   Dissection carried carefully down watching for the veins and nerve.  As we went below the deep fascia, we encountered a large abscess which was evacuated in its entirety.  We then irrigated thoroughly with 6000 mL of saline.  Some chlorhexidine wash was used both proximally and distally along with a lap sponge in order to facilitate removal of the abscess.  A Penrose drain was then placed proximally and 1 distally. Two sutures were used and then a large bulky dressing, which essentially would act as a splint.  Montez MoritaKeith Paul, PA-C assisted me throughout.  PROGNOSIS:  The patient will undergo dressing change tomorrow and then possible Penrose removal on Saturday with or without repeat debridement and irrigation and drainage.  The patient will remain on Medical Service with broad-spectrum antibiotics pending these cultures.     Doralee AlbinoMichael H. Carola FrostHandy, M.D.     MHH/MEDQ  D:  08/14/2016  T:  08/15/2016  Job:  [id number]

## 2016-08-19 LAB — BENZODIAZEPINES,MS,WB/SP RFX
7-Aminoclonazepam: NEGATIVE ng/mL
Alprazolam: NEGATIVE ng/mL
Benzodiazepines Confirm: POSITIVE
CLONAZEPAM: NEGATIVE ng/mL
Chlordiazepoxide: NEGATIVE ng/mL
DESALKYLFLURAZEPAM: NEGATIVE ng/mL
DESMETHYLCHLORDIAZEPOXIDE: NEGATIVE ng/mL
Desmethyldiazepam: NEGATIVE ng/mL
Diazepam: NEGATIVE ng/mL
FLURAZEPAM: NEGATIVE ng/mL
LORAZEPAM: NEGATIVE ng/mL
Midazolam: 4 ng/mL
OXAZEPAM: NEGATIVE ng/mL
TEMAZEPAM: NEGATIVE ng/mL
Triazolam: NEGATIVE ng/mL

## 2016-08-19 LAB — AEROBIC/ANAEROBIC CULTURE W GRAM STAIN (SURGICAL/DEEP WOUND)

## 2016-08-20 LAB — HEPATITIS B SURFACE ANTIBODY, QUANTITATIVE: HEPATITIS B-POST: 6.5 m[IU]/mL — AB

## 2016-08-20 LAB — HEPATITIS A ANTIBODY, TOTAL: Hep A Total Ab: NEGATIVE

## 2016-08-20 LAB — HEPATITIS B CORE ANTIBODY, TOTAL: HEP B C TOTAL AB: NEGATIVE

## 2016-08-25 LAB — DRUG SCREEN 10 W/CONF, SERUM
AMPHETAMINES, IA: NEGATIVE ng/mL
BARBITURATES, IA: NEGATIVE ug/mL
BENZODIAZEPINES, IA: POSITIVE ng/mL
COCAINE & METABOLITE, IA: NEGATIVE ng/mL
Methadone, IA: NEGATIVE ng/mL
Opiates, IA: NEGATIVE ng/mL
Oxycodones, IA: POSITIVE ng/mL
PROPOXYPHENE, IA: NEGATIVE ng/mL
Phencyclidine, IA: NEGATIVE ng/mL
THC(Marijuana) Metabolite, IA: NEGATIVE ng/mL

## 2016-08-25 LAB — OXYCODONES,MS,WB/SP RFX
OXYCODONES CONFIRMATION: POSITIVE
Oxycocone: 3.7 ng/mL
Oxymorphone: NEGATIVE ng/mL

## 2016-09-11 ENCOUNTER — Ambulatory Visit: Payer: Self-pay | Admitting: Internal Medicine

## 2017-10-19 DEATH — deceased

## 2018-10-02 IMAGING — CT CT FOREARM*R* W/CM
2 series · 15 of 20 positions shown, 18 images · IV contrast (agent unspecified)
Comparison: None.

CLINICAL DATA: Erythema of the forearm.  Possible abscess.

EXAM:
CT OF THE UPPER RIGHT EXTREMITY WITH CONTRAST
TECHNIQUE: Multidetector CT imaging of the upper right extremity was performed
according to the standard protocol following intravenous contrast
administration.

[Series 4: 2 1.5 mm st ax · axial · 0.53mm/px · z∈[-528,-258]mm · 12 of 214 slices shown, 15 images]
[im 17/214  soft-tissue]
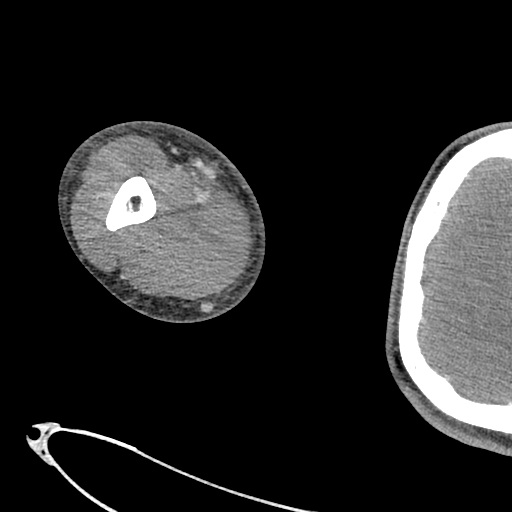
[im 17/214  bone]
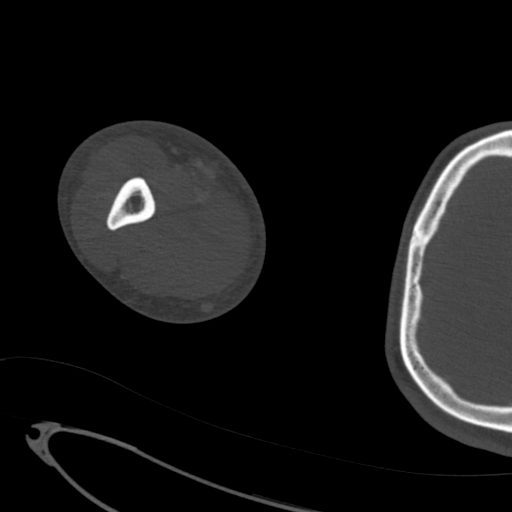
[im 33/214  bone]
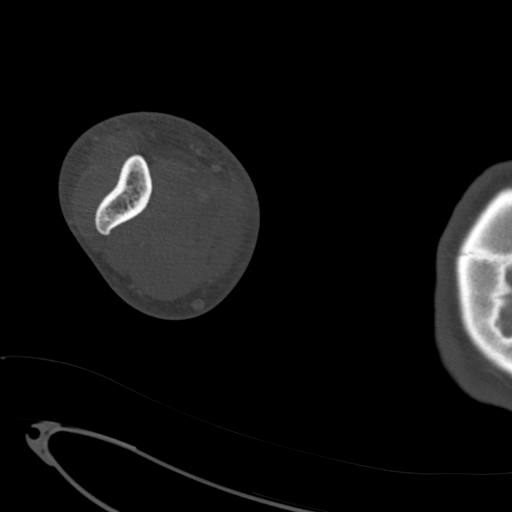
[im 50/214  bone]
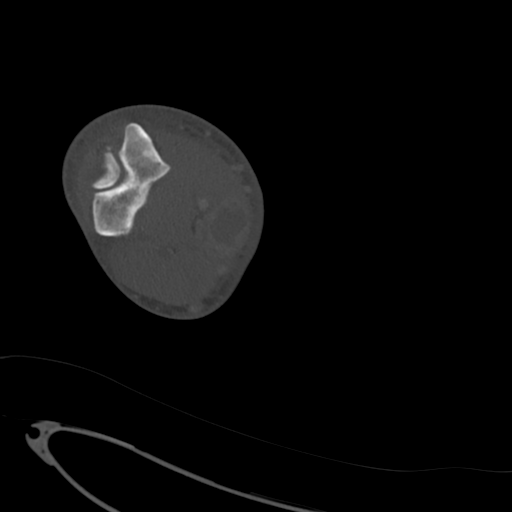
[im 66/214  bone]
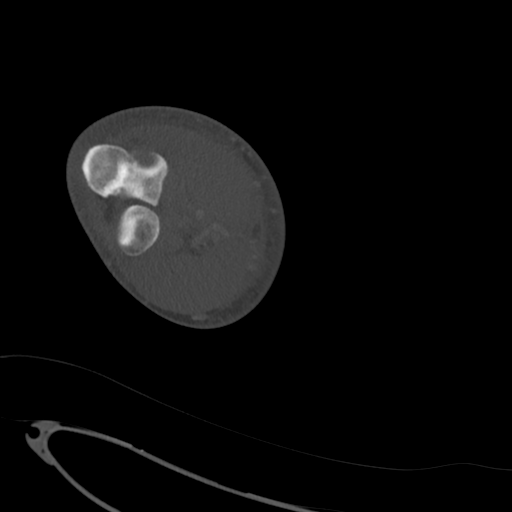
[im 82/214  soft-tissue]
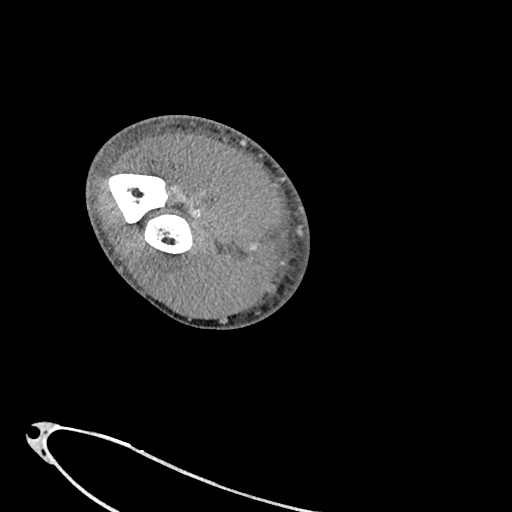
[im 82/214  bone]
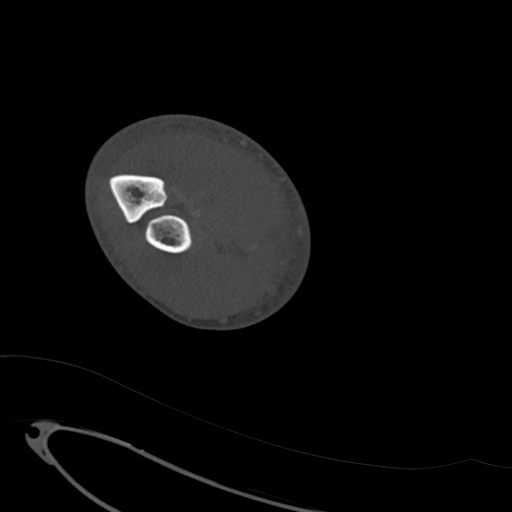
[im 99/214  bone]
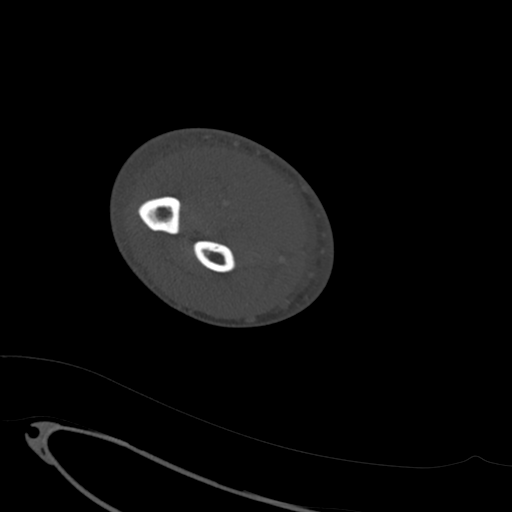
[im 115/214  bone]
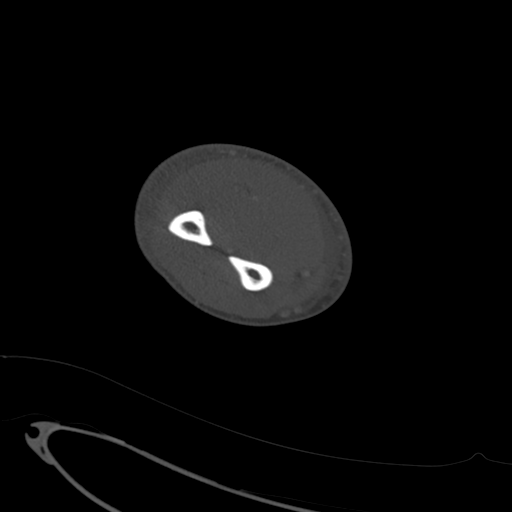
[im 132/214  bone]
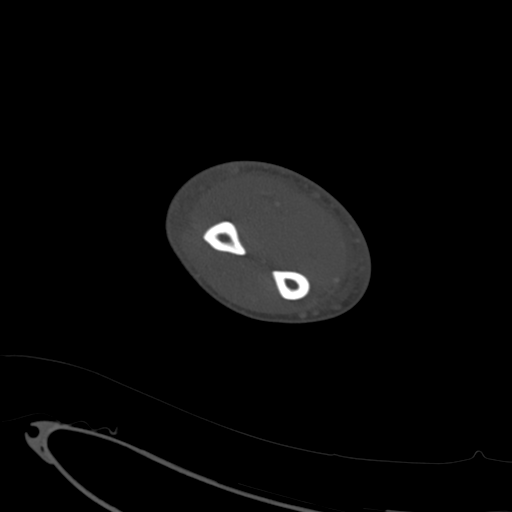
[im 148/214  soft-tissue]
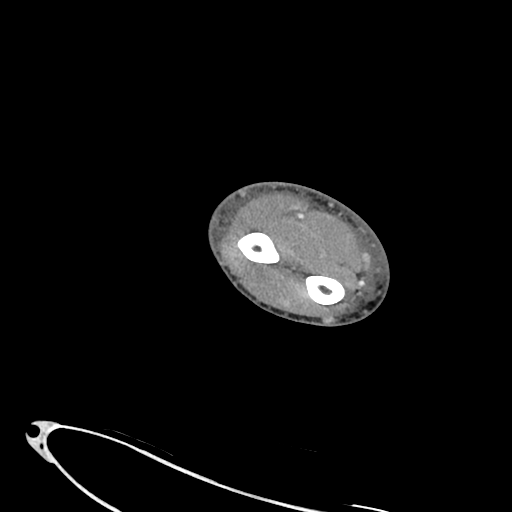
[im 148/214  bone]
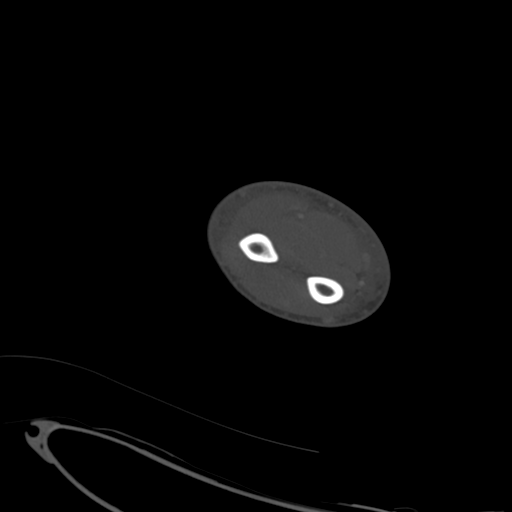
[im 164/214  bone]
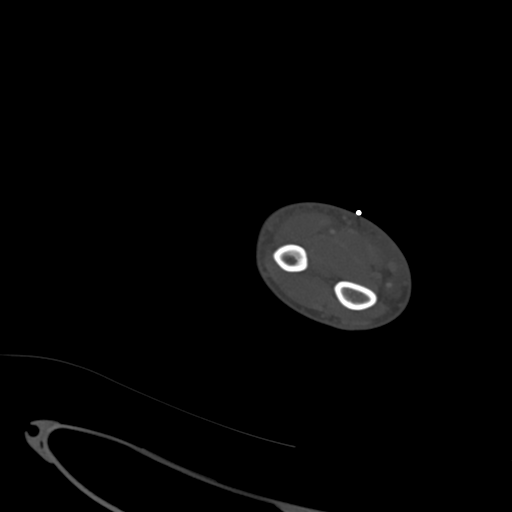
[im 181/214  bone]
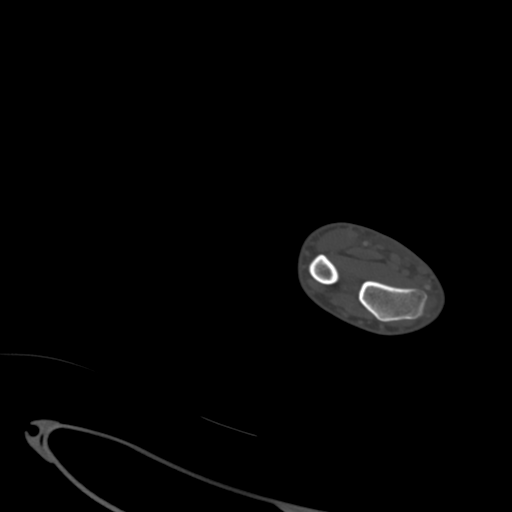
[im 197/214  bone]
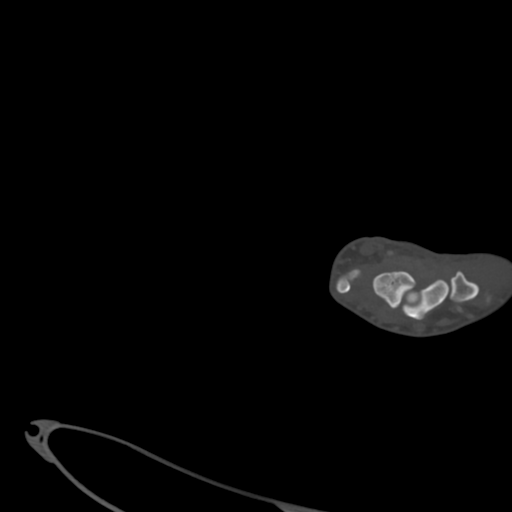

[Series 8: cor 1.5 mm st · coronal · 0.47mm/px · 3 of 134 slices shown]
[im 27/134  bone]
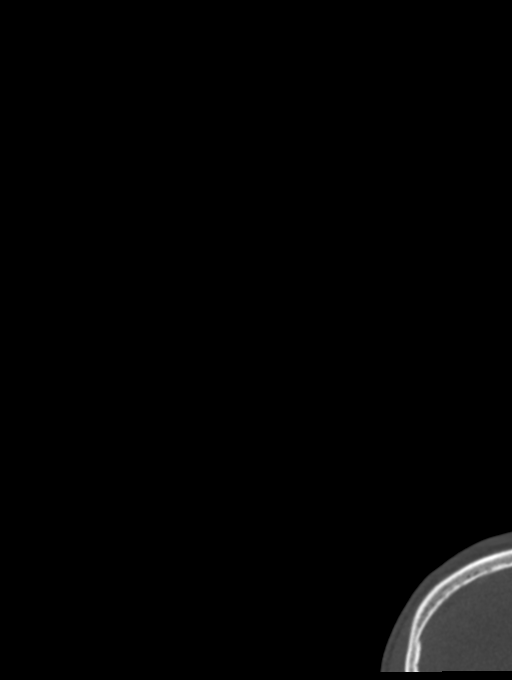
[im 54/134  bone]
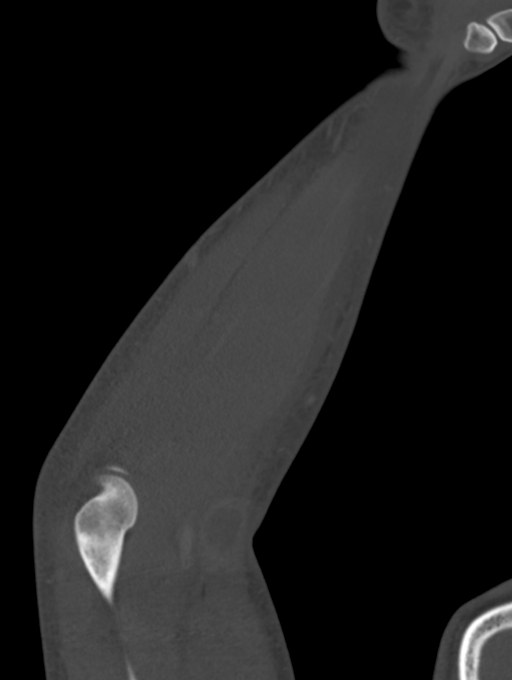
[im 80/134  bone]
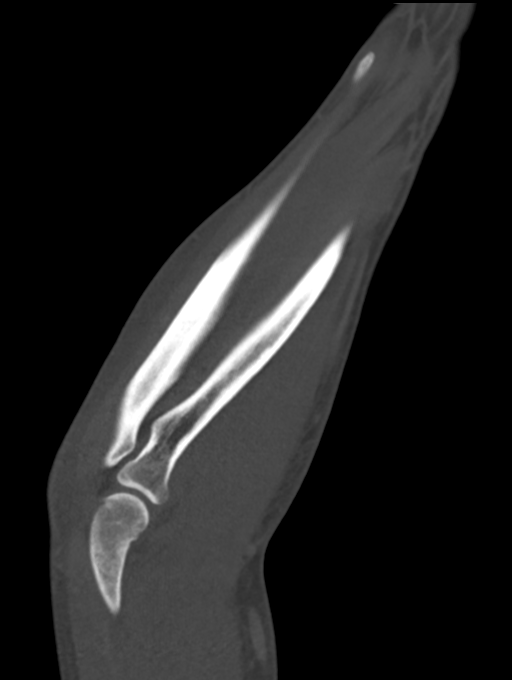

[15 of 20 positions shown; findings below may reference images not displayed]

CONTRAST:  100mL 65PBVO-1DD IOPAMIDOL (65PBVO-1DD) INJECTION
61%<Contrast>100mL 65PBVO-1DD IOPAMIDOL (65PBVO-1DD) INJECTION 61%
FINDINGS: Bones/Joint/Cartilage

No bony destructive findings characteristic of osteomyelitis. No
elbow joint effusion.

Ligaments

Suboptimally assessed by CT.

Muscles and Tendons

Edema within along the margins of the distal biceps muscle due to
the abscess. There is also fascia all edema tracking adjacent to the
pronator teres and brachialis muscles in the vicinity abscess.

Soft tissues

4.3 by 2.3 by 2.2 cm (volume = 11 cm^3) rim enhancing abscess
along the distal medial margin of the biceps and interposed between
the biceps, pronator teres, and brachialis muscles. This abscess is
directly in the vicinity of the median cubital vein which is
presumably thrombosed. There is some stranding in the vicinity of
the adjacent brachial artery and median nerve, as well as
inflammatory stranding along the distal upper arm.

Circumferential subcutaneous edema is present in the forearm and
tracks all the way down to the level of the radiocarpal joint. Aside
from the deeper fasciitis in the vicinity of the abscess itself, the
edema appears to be predominantly subcutaneous and no other
abscesses are identified.
IMPRESSION: 1. Abscess in the vicinity of the median cubital vein just proximal
to the elbow, which is thrombosed or occluded. The 11 cc abscess
tracks along the distal medial margin of the biceps muscle and
interposed between the biceps, pronator teres, and brachialis
muscles. There is local fascia all edema around the abscess along
with subcutaneous edema favoring cellulitis tracks in length
distally in the forearm to the level of the wrist. No elbow joint
effusion or CT findings of osteomyelitis.
